# Patient Record
Sex: Female | Born: 1940 | Race: White | Hispanic: No | Marital: Single | Smoking: Former smoker
Health system: Southern US, Community
[De-identification: ages and names within clinical notes are randomized; demographics above are authoritative.]

## PROBLEM LIST (undated history)

## (undated) DIAGNOSIS — H269 Unspecified cataract: Secondary | ICD-10-CM

## (undated) DIAGNOSIS — G8929 Other chronic pain: Secondary | ICD-10-CM

## (undated) DIAGNOSIS — I499 Cardiac arrhythmia, unspecified: Secondary | ICD-10-CM

## (undated) DIAGNOSIS — M549 Dorsalgia, unspecified: Secondary | ICD-10-CM

## (undated) DIAGNOSIS — G4700 Insomnia, unspecified: Secondary | ICD-10-CM

## (undated) DIAGNOSIS — I272 Pulmonary hypertension, unspecified: Secondary | ICD-10-CM

## (undated) DIAGNOSIS — F32A Depression, unspecified: Secondary | ICD-10-CM

## (undated) DIAGNOSIS — M810 Age-related osteoporosis without current pathological fracture: Secondary | ICD-10-CM

## (undated) DIAGNOSIS — M1990 Unspecified osteoarthritis, unspecified site: Secondary | ICD-10-CM

## (undated) DIAGNOSIS — Z8679 Personal history of other diseases of the circulatory system: Secondary | ICD-10-CM

## (undated) DIAGNOSIS — F329 Major depressive disorder, single episode, unspecified: Secondary | ICD-10-CM

## (undated) HISTORY — PX: DILATION AND CURETTAGE OF UTERUS: SHX78

## (undated) HISTORY — PX: EYE SURGERY: SHX253

## (undated) HISTORY — PX: TONSILLECTOMY: SUR1361

---

## 2017-04-15 ENCOUNTER — Other Ambulatory Visit: Payer: Self-pay | Admitting: Orthopedic Surgery

## 2017-04-15 ENCOUNTER — Ambulatory Visit
Admission: RE | Admit: 2017-04-15 | Discharge: 2017-04-15 | Disposition: A | Discharge status: Home / Self Care | Payer: Medicare Other | Source: Ambulatory Visit | Attending: Orthopedic Surgery | Admitting: Orthopedic Surgery

## 2017-04-15 DIAGNOSIS — S99912A Unspecified injury of left ankle, initial encounter: Secondary | ICD-10-CM

## 2018-12-17 ENCOUNTER — Other Ambulatory Visit: Payer: Self-pay | Admitting: Internal Medicine

## 2018-12-17 ENCOUNTER — Other Ambulatory Visit (HOSPITAL_COMMUNITY): Payer: Self-pay | Admitting: Internal Medicine

## 2018-12-17 DIAGNOSIS — I340 Nonrheumatic mitral (valve) insufficiency: Secondary | ICD-10-CM

## 2018-12-18 ENCOUNTER — Other Ambulatory Visit: Payer: Self-pay | Admitting: Internal Medicine

## 2018-12-18 DIAGNOSIS — Z1231 Encounter for screening mammogram for malignant neoplasm of breast: Secondary | ICD-10-CM

## 2019-02-19 ENCOUNTER — Other Ambulatory Visit: Payer: Self-pay

## 2019-02-19 ENCOUNTER — Emergency Department (HOSPITAL_BASED_OUTPATIENT_CLINIC_OR_DEPARTMENT_OTHER): Payer: Medicare Other

## 2019-02-19 ENCOUNTER — Encounter (HOSPITAL_BASED_OUTPATIENT_CLINIC_OR_DEPARTMENT_OTHER): Payer: Self-pay | Admitting: Emergency Medicine

## 2019-02-19 ENCOUNTER — Emergency Department (HOSPITAL_BASED_OUTPATIENT_CLINIC_OR_DEPARTMENT_OTHER)
Admission: EM | Admit: 2019-02-19 | Discharge: 2019-02-19 | Disposition: A | Discharge status: Home / Self Care | Payer: Medicare Other | Attending: Emergency Medicine | Admitting: Emergency Medicine

## 2019-02-19 DIAGNOSIS — S82141A Displaced bicondylar fracture of right tibia, initial encounter for closed fracture: Secondary | ICD-10-CM | POA: Diagnosis not present

## 2019-02-19 DIAGNOSIS — S8991XA Unspecified injury of right lower leg, initial encounter: Secondary | ICD-10-CM | POA: Diagnosis present

## 2019-02-19 DIAGNOSIS — Y92002 Bathroom of unspecified non-institutional (private) residence single-family (private) house as the place of occurrence of the external cause: Secondary | ICD-10-CM | POA: Diagnosis not present

## 2019-02-19 DIAGNOSIS — W182XXA Fall in (into) shower or empty bathtub, initial encounter: Secondary | ICD-10-CM | POA: Insufficient documentation

## 2019-02-19 DIAGNOSIS — Y999 Unspecified external cause status: Secondary | ICD-10-CM | POA: Diagnosis not present

## 2019-02-19 DIAGNOSIS — Y93F1 Activity, caregiving, bathing: Secondary | ICD-10-CM | POA: Diagnosis not present

## 2019-02-19 HISTORY — DX: Other chronic pain: G89.29

## 2019-02-19 HISTORY — DX: Insomnia, unspecified: G47.00

## 2019-02-19 HISTORY — DX: Dorsalgia, unspecified: M54.9

## 2019-02-19 MED ORDER — FENTANYL CITRATE (PF) 100 MCG/2ML IJ SOLN
25.0000 ug | Freq: Once | INTRAMUSCULAR | Status: AC
  Administered 2019-02-19: 13:00:00 25 ug via SUBCUTANEOUS
  Filled 2019-02-19: qty 2

## 2019-02-19 MED ORDER — OXYCODONE HCL 5 MG PO TABS
5.0000 mg | ORAL_TABLET | Freq: Once | ORAL | Status: AC
  Administered 2019-02-19: 12:00:00 5 mg via ORAL
  Filled 2019-02-19: qty 1

## 2019-02-19 MED ORDER — HYDROMORPHONE HCL 1 MG/ML IJ SOLN
0.5000 mg | Freq: Once | INTRAMUSCULAR | Status: AC | PRN
  Administered 2019-02-19: 0.5 mg via INTRAVENOUS
  Filled 2019-02-19: qty 1

## 2019-02-19 MED ORDER — HYDROMORPHONE HCL 1 MG/ML IJ SOLN
0.5000 mg | Freq: Once | INTRAMUSCULAR | Status: AC
  Administered 2019-02-19: 16:00:00 0.5 mg via SUBCUTANEOUS
  Filled 2019-02-19: qty 1

## 2019-02-19 MED ORDER — OXYCODONE HCL 5 MG PO TABS: 5.0000 mg | ORAL_TABLET | ORAL | 0 refills | Status: DC | PRN

## 2019-02-19 NOTE — ED Notes (Signed)
Brought to xr, assisted with positioning, pt reports increased pain with change in position.  Given oxycodone as ordered.  Pt requesting to speak to MD for more rapid acting medication.

## 2019-02-19 NOTE — ED Notes (Signed)
Pt in xr, MD gave additional pain medication order to be utilized if she is unable to maintain necessary positioning for xr studies.  XR tech made aware of orders if needed.

## 2019-02-19 NOTE — Progress Notes (Deleted)
..      Durable Medical Equipment  (From admission, onward)         Start     Ordered   02/19/19 1605  For home use only DME lightweight manual wheelchair with seat cushion  Once    Comments:  Patient suffers from a broken bone in the right knee which impairs their ability to perform daily activities like bathing, dressing and grooming in the home.  A cane, crutch or walker will not resolve  issue with performing activities of daily living. A wheelchair will allow patient to safely perform daily activities. Patient is not able to propel themselves in the home using a standard weight wheelchair due to non-weight bearing restriction to the right leg. Patient can self propel in the lightweight wheelchair.  Accessories: elevating leg rests (ELRs), wheel locks, extensions and anti-tippers.   02/19/19 1606

## 2019-02-19 NOTE — Discharge Instructions (Addendum)
You were evaluated in the Emergency Department and after careful evaluation, we did not find any emergent condition requiring admission or further testing in the hospital.  Your symptoms today seem to be due to a broken bone in the knee, specifically a tibial plateau fracture.  We discussed this injury with the orthopedic surgeons, and they recommend immobilization of the knee as well as nonweightbearing status.  This means that no weight can be put on the right leg.  We have arranged for a wheelchair to be picked up today.  Please call the orthopedic surgeon's office tomorrow to set up an appointment for early next week.  Dr. Ninfa Linden will be expecting your call.  Please return to the Emergency Department if you experience any worsening of your condition.  We encourage you to follow up with a primary care provider.  Thank you for allowing [place] to be a part of your care.

## 2019-02-19 NOTE — ED Triage Notes (Signed)
Per EMS:  Pt was in the shower and slipped.  Fell on right knee.  Some swelling and painful to touch.  No head injury.

## 2019-02-19 NOTE — ED Provider Notes (Signed)
[facility] Emergency Department Provider Note MRN:  [id number]  Arrival date & time: 02/19/19     Chief Complaint   Fall   History of Present Illness   Michelle Vaughan is a 78 y.o. year-old female with no pertinent past medical history presenting to the ED with chief complaint of fall.  Patient explains that due to the coronavirus she was moved from her independent living facility back with her son.  Was using the sons shower, slipped and fell onto her right side.  Mostly fell onto her right knee.  Pain in the knee is moderate in severity, worse with motion.  Denies chest pain or shortness of breath, denies abdominal pain, denies head trauma, no loss of consciousness, denies anticoagulant use.  Pain seems to radiate to the right hip as well as the lower leg.  Review of Systems  A complete 10 system review of systems was obtained and all systems are negative except as noted in the HPI and PMH.   Patient's Health History    Past Medical History:  Diagnosis Date  . Chronic back pain   . Insomnia     History reviewed. No pertinent surgical history.  No family history on file.  Social History   Socioeconomic History  . Marital status: Single    Spouse name: Not on file  . Number of children: Not on file  . Years of education: Not on file  . Highest education level: Not on file  Occupational History  . Not on file  Social Needs  . Financial resource strain: Not on file  . Food insecurity:    Worry: Not on file    Inability: Not on file  . Transportation needs:    Medical: Not on file    Non-medical: Not on file  Tobacco Use  . Smoking status: Not on file  Substance and Sexual Activity  . Alcohol use: Not on file  . Drug use: Not on file  . Sexual activity: Not on file  Lifestyle  . Physical activity:    Days per week: Not on file    Minutes per session: Not on file  . Stress: Not on file  Relationships  . Social connections:    Talks on  phone: Not on file    Gets together: Not on file    Attends religious service: Not on file    Active member of club or organization: Not on file    Attends meetings of clubs or organizations: Not on file    Relationship status: Not on file  . Intimate partner violence:    Fear of current or ex partner: Not on file    Emotionally abused: Not on file    Physically abused: Not on file    Forced sexual activity: Not on file  Other Topics Concern  . Not on file  Social History Narrative  . Not on file     Physical Exam  Vital Signs and Nursing Notes reviewed Vitals:   02/19/19 1419 02/19/19 1557  BP: 138/69 (!) 136/100  Pulse: (!) 57 88  Resp: 16 20  Temp:    SpO2: 100% 100%    CONSTITUTIONAL: Well-appearing, NAD NEURO:  Alert and oriented x 3, no focal deficits EYES:  eyes equal and reactive ENT/NECK:  no LAD, no JVD CARDIO: Regular rate, well-perfused, normal S1 and S2 PULM:  CTAB no wheezing or rhonchi GI/GU:  normal bowel sounds, non-distended, non-tender MSK/SPINE:  No gross deformities, no  edema; mild edema to the right knee with tenderness to palpation to the right knee, right shin, right ankle. SKIN:  no rash, atraumatic PSYCH:  Appropriate speech and behavior  Diagnostic and Interventional Summary    Labs Reviewed - No data to display  DG Pelvis 1-2 Views  Final Result    DG Femur Min 2 Views Right  Final Result    DG Knee Complete 4 Views Right  Final Result    DG Tibia/Fibula Right  Final Result    DG Ankle Complete Right  Final Result    CT Knee Right Wo Contrast    (Results Pending)    Medications  oxyCODONE (Oxy IR/ROXICODONE) immediate release tablet 5 mg (5 mg Oral Given 02/19/19 1226)  fentaNYL (SUBLIMAZE) injection 25 mcg (25 mcg Subcutaneous Given 02/19/19 1255)  HYDROmorphone (DILAUDID) injection 0.5 mg (0.5 mg Subcutaneous Given 02/19/19 1550)  HYDROmorphone (DILAUDID) injection 0.5 mg (0.5 mg Intravenous Given 02/19/19 1626)     Procedures  Critical Care  ED Course and Medical Decision Making  I have reviewed the triage vital signs and the nursing notes.  Pertinent labs & imaging results that were available during my care of the patient were reviewed by me and considered in my medical decision making (see below for details).  Mechanical ground-level fall in this 78 year old female with few comorbidities, no blood thinners, no head trauma, x-rays to exclude fracture.  Strong peripheral pulses at the right dorsalis pedis, neurovascularly intact.        Durable Medical Equipment  (From admission, onward)         Start     Ordered   02/19/19 1636  For home use only DME lightweight manual wheelchair with seat cushion  Once    Comments:  Patient suffers from a broken bone in the right knee which impairs their ability to perform daily activities like bathing, dressing and grooming in the home.  A cane, crutch or walker will not resolve  issue with performing activities of daily living. A wheelchair will allow patient to safely perform daily activities. Patient is not able to propel themselves in the home using a standard weight wheelchair due to non-weight bearing restriction to the right leg. Patient can self propel in the lightweight wheelchair.  Accessories: back/seat cushion, elevating leg rests (ELRs), wheel locks, extensions and anti-tippers.   02/19/19 1636   02/19/19 1627  For home use only DME 3 n 1  Once     02/19/19 1629         X-rays reveal tibial plateau fracture, which was discussed with Dr. Ninfa Linden of orthopedic surgery.  Will obtain CT for possible future surgical planning, will follow up with Dr. Ninfa Linden in his office early next week.  Discussed plan with family, patient will need to be nonweightbearing on the right leg, we have consulted case management to provide family with wheelchair.  Both patient and patient's family feel comfortable assisting the patient through this nonweightbearing restriction until  her follow-up next week.  Still awaiting CT, signed out to Dr. Rogene Houston at shift change ready for dc afterwards.  Placed in immobilizer for comfort.  Barth Kirks. [place] Small, [place] [email or web address]  Final Clinical Impressions(s) / ED Diagnoses     ICD-10-CM   1. Closed fracture of right tibial plateau, initial encounter S82.141A     ED Discharge Orders         Ordered    oxyCODONE (ROXICODONE) 5 MG immediate  release tablet  Every 4 hours PRN     02/19/19 1652             Maudie Flakes, MD 02/19/19 1655

## 2019-02-19 NOTE — ED Notes (Signed)
Pt placed on bedpan. Complaining of pain upon movement. Initially declined pain med but requested some due to increased pain. Notified RN

## 2019-02-19 NOTE — ED Notes (Signed)
Patient transported to CT 

## 2019-02-19 NOTE — ED Notes (Signed)
Pt's daughter to arrive at 1830 to take pt home.

## 2019-02-19 NOTE — Progress Notes (Addendum)
TOC CM spoke to pt's dtr, Olin Hauser. States pt is staying in [place] at her home. States she will pick up wheelchair and 3n1 bedside commode at the retail store in am on [address]. States she will contact Orthopedics Office to arrange follow up appt. Pt has a RW at home. [place] with new referral. Jonnie Finner RN CCM Case Mgmt phone [phone]

## 2019-02-19 NOTE — ED Notes (Signed)
ED Provider at bedside. 

## 2019-02-23 ENCOUNTER — Other Ambulatory Visit: Payer: Self-pay

## 2019-02-23 ENCOUNTER — Encounter (INDEPENDENT_AMBULATORY_CARE_PROVIDER_SITE_OTHER): Payer: Self-pay | Admitting: Orthopaedic Surgery

## 2019-02-23 ENCOUNTER — Ambulatory Visit (INDEPENDENT_AMBULATORY_CARE_PROVIDER_SITE_OTHER): Payer: Medicare Other | Admitting: Orthopaedic Surgery

## 2019-02-23 DIAGNOSIS — S82141A Displaced bicondylar fracture of right tibia, initial encounter for closed fracture: Secondary | ICD-10-CM | POA: Diagnosis not present

## 2019-02-23 MED ORDER — HYDROCODONE-ACETAMINOPHEN 5-325 MG PO TABS: 1.0000 | ORAL_TABLET | Freq: Four times a day (QID) | ORAL | 0 refills | Status: DC | PRN

## 2019-02-23 NOTE — Progress Notes (Signed)
Office Visit Note   Patient: Michelle Vaughan           Date of Birth: Nov 30, 1940           MRN: [id number] Visit Date: 02/23/2019              Requested by: No referring provider defined for this encounter. PCP: System, Pcp Not In   Assessment & Plan: Visit Diagnoses:  1. Tibial plateau fracture, right, closed, initial encounter     Plan: CT images were reviewed with the patient and her daughter in law who is present today.  Dr. Ninfa Linden and myself discussed with her conservative treatment versus surgical intervention.  She would like to proceed with surgical intervention in the near future.  Therefore we will proceed with a right tibial plateau fracture open reduction internal fixation.  Risks benefits discussed with patient at length.  Option of conservative treatment was given to the patient she defers.  She will remain in the knee immobilizer and nonweightbearing until the time of surgery.  Follow-Up Instructions: Return 2 WEEKS POST-OP.   Orders:  No orders of the defined types were placed in this encounter.  Meds ordered this encounter  Medications  . HYDROcodone-acetaminophen (NORCO/VICODIN) 5-325 MG tablet    Sig: Take 1-2 tablets by mouth every 6 (six) hours as needed for moderate pain.    Dispense:  30 tablet    Refill:  0      Procedures: No procedures performed   Clinical Data: No additional findings.   Subjective: Chief Complaint  Patient presents with  . Right Knee - Injury, Fracture, Pain    HPI Michelle Vaughan is a 78 year old female who referred from the ER due to a right tibial plateau fracture.  She reports she was showering on 02/19/2019 and slipped in the shower injuring the right knee.  She went to the [facility], ER where she was found to have a comminuted lateral tibial plateau fracture with some impaction and moderate displacement.  She denies any loss of consciousness or dizziness at the time of the incident.  She landed predominantly on the right  side and has some bruising down the right lower extremity.  She is placed in a knee immobilizer and has been nonweightbearing.  Patient usually lives in independent living however had been at her family's home due to the COVID-19 pandemic potential risk of being in a skilled facility during this time.  Past medical history pertinent for cholesteremia, hypoglycemia, gastric reflux, chronic back pain and history of hematuria which was worked up.  She denies any chest pain, shortness of breath, orthopnea.  No history of DVT PE does have a remote history of phlebitis.  CT scan dated 02/19/2019 right knee showed comminuted fracture of the lateral tibial plateau with some impaction and up to 8 mm of depression.  Fracture fragments are displaced laterally and posteriorly.  Posterior displacement of approximately 3 mm.  There is a nondisplaced fracture of the medial right tibial spine.  No other fractures evident.  Knee is well located.  No effusion.  Review of Systems  Constitutional: Negative for chills and fever.  Respiratory: Negative for shortness of breath.   Cardiovascular: Negative for chest pain.  Musculoskeletal: Positive for back pain.  Neurological: Negative for dizziness and light-headedness.     Objective: Vital Signs: There were no vitals taken for this visit.  Physical Exam Constitutional:      Appearance: She is not ill-appearing or diaphoretic.  Pulmonary:  Effort: Pulmonary effort is normal.  Neurological:     Mental Status: She is alert and oriented to person, place, and time.  Psychiatric:        Mood and Affect: Mood normal.     Ortho Exam Right knee slight edema.  No skin breakdown.  Minimal ecchymosis lateral aspect of the right knee.  Tenderness along the lateral joint line.  No gross deformity.  Left calf supple nontender.  Dorsal pedal pulses intact.  Slight tenderness over the medial lateral malleolus right ankle.  Specialty Comments:  No specialty comments  available.  Imaging: No results found.   PMFS History: Patient Active Problem List   Diagnosis Date Noted  . Tibial plateau fracture, right, closed, initial encounter 02/23/2019   Past Medical History:  Diagnosis Date  . Chronic back pain   . Insomnia     No family history on file.  No past surgical history on file. Social History   Occupational History  . Not on file  Tobacco Use  . Smoking status: Not on file  Substance and Sexual Activity  . Alcohol use: Not on file  . Drug use: Not on file  . Sexual activity: Not on file

## 2019-02-24 NOTE — Pre-Procedure Instructions (Signed)
CAEDENCE SNOWDEN  02/24/2019      [id number] - [place], [place] - [address] [place] [address] SEC [address] [place] [address] [place] [address] [place] [address] Phone: [phone] Fax: [phone]    Your procedure is scheduled on Friday 02/27/2019.  [facility] admitting office at 0530 A.M.  Call this number if you have problems the morning of surgery:  [phone]   Remember:  Do not eat or drink after midnight.     Take these medicines the morning of surgery with A SIP OF WATER: Clonazepam (Klonopin) Escitalopram (Lexapro) Rosuvastatin (Crestor)  If Needed:  Hydrocodone-acetaminophen (Norco) Oxycodone (Roxicodone)  7 days prior to surgery STOP taking any Aspirin (unless otherwise instructed by your surgeon), Aleve, Naproxen, Ibuprofen, Motrin, Advil, Goody's, BC's, all herbal medications, fish oil, and all vitamins.    Do not wear jewelry, make-up or nail polish.  Do not wear lotions, powders, or perfumes, or deodorant.  Do not shave 48 hours prior to surgery.   Do not bring valuables to the hospital.  [facility] is not responsible for any belongings or valuables.  Contacts, eyeglasses, hearing aids, dentures or bridgework may not be worn into surgery.  Leave your suitcase in the car.  After surgery it may be brought to your room.  For patients admitted to the hospital, discharge time will be determined by your treatment team.  Patients discharged the day of surgery will not be allowed to drive home.   Name and phone number of your driver:    Special instructions:   [place]- Preparing For Surgery  Before surgery, you can play an important role. Because skin is not sterile, your skin needs to be as free of germs as possible. You can reduce the number of germs on your skin by washing with CHG (chlorahexidine gluconate) Soap before surgery.  CHG is an antiseptic cleaner which kills germs and bonds with the skin to  continue killing germs even after washing.    Oral Hygiene is also important to reduce your risk of infection.  Remember - BRUSH YOUR TEETH THE MORNING OF SURGERY WITH YOUR REGULAR TOOTHPASTE  Please do not use if you have an allergy to CHG or antibacterial soaps. If your skin becomes reddened/irritated stop using the CHG.  Do not shave (including legs and underarms) for at least 48 hours prior to first CHG shower. It is OK to shave your face.  Please follow these instructions carefully.   1. Shower the NIGHT BEFORE SURGERY and the MORNING OF SURGERY with CHG.   2. If you chose to wash your hair, wash your hair first as usual with your normal shampoo.  3. After you shampoo, rinse your hair and body thoroughly to remove the shampoo.  4. Use CHG as you would any other liquid soap. You can apply CHG directly to the skin and wash gently with a scrungie or a clean washcloth.   5. Apply the CHG Soap to your body ONLY FROM THE NECK DOWN.  Do not use on open wounds or open sores. Avoid contact with your eyes, ears, mouth and genitals (private parts). Wash Face and genitals (private parts)  with your normal soap.  6. Wash thoroughly, paying special attention to the area where your surgery will be performed.  7. Thoroughly rinse your body with warm water from the neck down.  8. DO NOT shower/wash with your normal soap after  using and rinsing off the CHG Soap.  9. Pat yourself dry with a CLEAN TOWEL.  10. Wear CLEAN PAJAMAS to bed the night before surgery, wear comfortable clothes the morning of surgery  11. Place CLEAN SHEETS on your bed the night of your first shower and DO NOT SLEEP WITH PETS.    Day of Surgery: Shower as stated above. Do not apply any deodorants/lotions.  Please wear clean clothes to the hospital/surgery center.   Remember to brush your teeth WITH YOUR REGULAR TOOTHPASTE.    Please read over the following fact sheets that you were given. Pain Booklet, Coughing and  Deep Breathing and Surgical Site Infection Prevention

## 2019-02-25 ENCOUNTER — Telehealth (INDEPENDENT_AMBULATORY_CARE_PROVIDER_SITE_OTHER): Payer: Self-pay

## 2019-02-25 ENCOUNTER — Other Ambulatory Visit (INDEPENDENT_AMBULATORY_CARE_PROVIDER_SITE_OTHER): Payer: Self-pay | Admitting: Physician Assistant

## 2019-02-25 ENCOUNTER — Inpatient Hospital Stay (HOSPITAL_COMMUNITY)
Admission: RE | Admit: 2019-02-25 | Discharge: 2019-02-25 | Disposition: A | Discharge status: Home / Self Care | Payer: Medicare Other | Source: Ambulatory Visit

## 2019-02-25 NOTE — Telephone Encounter (Signed)
I am fine for the patient to have an order for prescription for an electric lift chair.  Are not there is usually done through some specific company or through some type of order that the company has.  I am fine with what ever is needed.  Even if is just a prescription that says that the patient needs an electric lift chair due to severe lower extremity pain and weakness.

## 2019-02-25 NOTE — Telephone Encounter (Signed)
Wanting to see if Dr. Ninfa Linden could give an order for a electric lift chair for pt. Says pt has chronic back pain which is worse at this time due to her leg pain and having to lay a lot.

## 2019-02-26 ENCOUNTER — Encounter (HOSPITAL_COMMUNITY): Payer: Self-pay

## 2019-02-26 ENCOUNTER — Other Ambulatory Visit: Payer: Self-pay

## 2019-02-26 ENCOUNTER — Encounter (HOSPITAL_COMMUNITY)
Admission: RE | Admit: 2019-02-26 | Discharge: 2019-02-26 | Disposition: A | Discharge status: Home / Self Care | Payer: Medicare Other | Source: Ambulatory Visit | Attending: Orthopaedic Surgery | Admitting: Orthopaedic Surgery

## 2019-02-26 ENCOUNTER — Encounter (HOSPITAL_COMMUNITY): Payer: Self-pay | Admitting: Certified Registered Nurse Anesthetist

## 2019-02-26 DIAGNOSIS — Z01818 Encounter for other preprocedural examination: Secondary | ICD-10-CM

## 2019-02-26 HISTORY — DX: Unspecified cataract: H26.9

## 2019-02-26 HISTORY — DX: Cardiac arrhythmia, unspecified: I49.9

## 2019-02-26 HISTORY — DX: Major depressive disorder, single episode, unspecified: F32.9

## 2019-02-26 HISTORY — DX: Age-related osteoporosis without current pathological fracture: M81.0

## 2019-02-26 HISTORY — DX: Unspecified osteoarthritis, unspecified site: M19.90

## 2019-02-26 HISTORY — DX: Pulmonary hypertension, unspecified: I27.20

## 2019-02-26 HISTORY — DX: Personal history of other diseases of the circulatory system: Z86.79

## 2019-02-26 HISTORY — DX: Depression, unspecified: F32.A

## 2019-02-26 LAB — COMPREHENSIVE METABOLIC PANEL
ALT: 14 U/L (ref 0–44)
AST: 21 U/L (ref 15–41)
Albumin: 2.8 g/dL — ABNORMAL LOW (ref 3.5–5.0)
Alkaline Phosphatase: 67 U/L (ref 38–126)
Anion gap: 10 (ref 5–15)
BUN: 14 mg/dL (ref 8–23)
CO2: 24 mmol/L (ref 22–32)
Calcium: 8.9 mg/dL (ref 8.9–10.3)
Chloride: 107 mmol/L (ref 98–111)
Creatinine, Ser: 0.71 mg/dL (ref 0.44–1.00)
GFR calc Af Amer: >60 mL/min (ref >60)
GFR calc non Af Amer: >60 mL/min (ref >60)
Glucose, Bld: 131 mg/dL — ABNORMAL HIGH (ref 70–99)
Potassium: 3.6 mmol/L (ref 3.5–5.1)
Sodium: 141 mmol/L (ref 135–145)
Total Bilirubin: 0.5 mg/dL (ref 0.3–1.2)
Total Protein: 6 g/dL — ABNORMAL LOW (ref 6.5–8.1)

## 2019-02-26 LAB — CBC
HCT: 33.8 % — ABNORMAL LOW (ref 36.0–46.0)
Hemoglobin: 11 g/dL — ABNORMAL LOW (ref 12.0–15.0)
MCH: 31.2 pg (ref 26.0–34.0)
MCHC: 32.5 g/dL (ref 30.0–36.0)
MCV: 95.8 fL (ref 80.0–100.0)
Platelets: 216 10*3/uL (ref 150–400)
RBC: 3.53 MIL/uL — ABNORMAL LOW (ref 3.87–5.11)
RDW: 13.2 % (ref 11.5–15.5)
WBC: 6.9 10*3/uL (ref 4.0–10.5)
nRBC: 0 % (ref 0.0–0.2)

## 2019-02-26 NOTE — Telephone Encounter (Signed)
LMOM for patient to call me back and let me know if she would like to pick up Rx, or for me to mail it?

## 2019-02-26 NOTE — Progress Notes (Signed)
Sent to [place], [place] for review of cardiac history.  Received requested fax from Dr. Brigitte Pulse, [facility].

## 2019-02-26 NOTE — Pre-Procedure Instructions (Addendum)
Michelle Vaughan  02/26/2019      [facility] #[id number] - [place], [place] - [address] [place] [address] [place] [address] Phone: [phone] Fax: [phone]    Your procedure is scheduled on Friday 02/27/2019.  [facility] admitting office at 0530 A.M.  Call this number if you have problems the morning of surgery:  [phone]  Remember:  Do not eat after midnight.  You may have Clear liquads until 430 am Friday,  4/17  Coffee and tea, regular and decaf Plain Jell-O, Iced Popsicles, Carbonated beverages, Cranberry, grape and apple juices Sports drinks like Gatorade Lightly seasoned clear broth or consume(fat free)  Take these medicines the morning of surgery with by 4:30 am day of surgery:   Hydrocodone-acetaminophen (Norco) or  Oxycodone (Roxicodone) if needed  Drink the Ensure Pre-Surgery Drink by 430 am Friday  STOP Now taking any Aspirin (unless otherwise instructed by your surgeon), Aleve, Naproxen, Ibuprofen, Motrin, Advil, Goody's, BC's, all herbal medications, fish oil, and all vitamins.    Do not wear jewelry, make-up or nail polish.  Do not wear lotions, powders, or perfumes, or  deodorant.  Do not shave 48 hours prior to surgery.   Do not bring valuables to the hospital.  [facility] is not responsible for any  belongings or valuables.  Contacts, eyeglasses, hearing aids, dentures or bridgework may not be worn into surgery.  Leave your suitcase in the car.  After surgery it may be brought to your room.  For patients admitted to the hospital, discharge time will be determined by your treatment team.  Anesthesia to review - yes   Special instructions:   [place]- Preparing For Surgery  Before surgery, you can play an important role. Because skin is not sterile, your skin needs to be as free of germs as possible. You can reduce the number of germs on your skin by washing  with CHG (chlorahexidine gluconate) Soap before surgery.  CHG is an antiseptic cleaner which kills germs and bonds with the skin to continue killing germs even after washing.    Oral Hygiene is also important to reduce your risk of infection.  Remember - BRUSH YOUR TEETH THE MORNING OF SURGERY WITH YOUR REGULAR TOOTHPASTE  Please do not use if you have an allergy to CHG or antibacterial soaps. If your skin becomes reddened/irritated stop using the CHG.  Do not shave (including legs and underarms) for at least 48 hours prior to first CHG shower. It is OK to shave your face.  Please follow these instructions carefully.   1. Shower the [employer] BEFORE SURGERY (Thurs) and the MORNING OF SURGERY (Fri) with CHG.   2. If you chose to wash your hair, wash your hair first as usual with your normal shampoo.  3. After you shampoo, rinse your hair and body thoroughly to remove the shampoo.  4. Use CHG as you would any other liquid soap. You can apply CHG directly to the skin and wash gently with a scrungie or a clean washcloth.   5. Apply the CHG Soap to your body ONLY FROM THE NECK DOWN.  Do not use on open wounds or open sores. Avoid contact with your eyes, ears, mouth and genitals (private parts). Wash Face and genitals (private parts)  with your normal soap.  6. Wash thoroughly, paying special attention to the area where your surgery will be performed.  7. Thoroughly rinse your body with warm water from the neck down.  8. DO NOT shower/wash with your normal soap after using and rinsing off the CHG Soap.  9. Pat yourself dry with a CLEAN TOWEL.  10. Wear CLEAN PAJAMAS to bed the night before surgery, wear comfortable clothes the morning of surgery  11. Place CLEAN SHEETS on your bed the night of your first shower and DO NOT SLEEP WITH PETS.   Day of Surgery: Shower as stated above. Do not apply any deodorants/lotions.  Please wear clean clothes to the hospital/surgery center.   Remember to  brush your teeth WITH YOUR REGULAR TOOTHPASTE.    Please read over the following fact sheets that you were given.  Patient informed of the hospital visitation restriction policy that is now currently in effect.

## 2019-02-26 NOTE — Anesthesia Preprocedure Evaluation (Addendum)
Anesthesia Evaluation  Patient identified by MRN, date of birth, ID band Patient awake    Reviewed: Allergy & Precautions, NPO status , Patient's Chart, lab work & pertinent test results  Airway Mallampati: II  TM Distance: >3 FB Neck ROM: Full    Dental  (+) Dental Advisory Given, Teeth Intact   Pulmonary neg pulmonary ROS, former smoker,    Pulmonary exam normal breath sounds clear to auscultation       Cardiovascular negative cardio ROS Normal cardiovascular exam+ dysrhythmias  Rhythm:Regular Rate:Normal     Neuro/Psych PSYCHIATRIC DISORDERS Depression negative neurological ROS     GI/Hepatic negative GI ROS, Neg liver ROS,   Endo/Other  negative endocrine ROS  Renal/GU negative Renal ROS     Musculoskeletal negative musculoskeletal ROS (+) Arthritis ,   Abdominal   Peds  Hematology negative hematology ROS (+)   Anesthesia Other Findings Day of surgery medications reviewed with the patient.  Reproductive/Obstetrics negative OB ROS                           Anesthesia Physical Anesthesia Plan  ASA: III  Anesthesia Plan: General   Post-op Pain Management: GA combined w/ Regional for post-op pain   Induction: Intravenous and Rapid sequence  PONV Risk Score and Plan: 4 or greater and Ondansetron, Dexamethasone and Treatment may vary due to age or medical condition  Airway Management Planned: Oral ETT  Additional Equipment: None  Intra-op Plan:   Post-operative Plan: Extubation in OR  Informed Consent: I have reviewed the patients History and Physical, chart, labs and discussed the procedure including the risks, benefits and alternatives for the proposed anesthesia with the patient or authorized representative who has indicated his/her understanding and acceptance.     Dental advisory given  Plan Discussed with: CRNA  Anesthesia Plan Comments: (Routine echo's ordered by PCP  to monitor mild PHTN, mild MR, and atrial septal aneurysm. Last Echo 08/04/2017 done in [place]. Results below, copy on pt chart.  TTE 08/04/2017: Conclusions: MVP borderline TR and MR mild, AR trace, estimated RVSP 30 mmHg LV diastolic dysfunction, grade 2, good LV systolic function, EF 65 to 70%.  Findings: Left ventricle: Calculated left ventricular ejection fraction with apical correction factor 65 to 70%.  Normal left ventricular chamber size.  Normal left ventricular systolic function.  Left ventricular diastolic dysfunction, grade 2.  Normal left ventricular wall thickness.  No significant wall motion abnormality. Right ventricle: Estimated right ventricular systolic pressure; 30 mmHg.  Normal right ventricular size.  Normal right ventricular systolic function. Left atrium: Normal left atrial size. Right atrium: Normal right atrial size. Aortic valve: Aortic valve is minimal to mildly sclerotic in structure, good mobility and increased in thickness, trace aortic regurgitation. Mitral valve: Mitral valve is borderline bowing in structure, good mobility and thickness.  Mild mitral valve regurgitation. Pulmonary valve: Pulmonary valve is normal in structure, mobility and thickness.  No pulmonary valve regurgitation.  New Tricuspid valve: Tricuspid valve is normal in structure, mobility and thickness.  Mild tricuspid regurgitation. Pericardium and spaces: No significant pericardial effusion. Aorta: Aortic root and proximal aorta are normal in dimension and minimal to mildly sclerotic in structure. Other echo findings: Hypermobile intra-atrial septum and borderline bulges into atrial cavity.  No evidence of shunt at atrial level.  No evidence of intracardiac mass or thrombus.  Normal inferior vena cava dimension with visualized inspiratory collapse.)      Anesthesia Quick Evaluation

## 2019-02-26 NOTE — Telephone Encounter (Signed)
Patient called back and will pick up Rx, I put this at front desk

## 2019-02-27 ENCOUNTER — Inpatient Hospital Stay (HOSPITAL_COMMUNITY): Payer: Medicare Other | Admitting: Certified Registered Nurse Anesthetist

## 2019-02-27 ENCOUNTER — Telehealth: Payer: Self-pay | Admitting: Internal Medicine

## 2019-02-27 ENCOUNTER — Encounter (HOSPITAL_COMMUNITY)
Admission: RE | Disposition: A | Discharge status: Home Health | Payer: Self-pay | Source: Home / Self Care | Attending: Orthopaedic Surgery

## 2019-02-27 ENCOUNTER — Encounter (HOSPITAL_COMMUNITY): Payer: Self-pay | Admitting: Certified Registered Nurse Anesthetist

## 2019-02-27 ENCOUNTER — Inpatient Hospital Stay (HOSPITAL_COMMUNITY): Payer: Medicare Other

## 2019-02-27 ENCOUNTER — Inpatient Hospital Stay (HOSPITAL_COMMUNITY)
Admission: RE | Admit: 2019-02-27 | Discharge: 2019-03-03 | DRG: 494 | Disposition: A | Discharge status: Home Health | Payer: Medicare Other | Attending: Orthopaedic Surgery | Admitting: Orthopaedic Surgery

## 2019-02-27 DIAGNOSIS — I272 Pulmonary hypertension, unspecified: Secondary | ICD-10-CM | POA: Diagnosis present

## 2019-02-27 DIAGNOSIS — F329 Major depressive disorder, single episode, unspecified: Secondary | ICD-10-CM | POA: Diagnosis present

## 2019-02-27 DIAGNOSIS — M810 Age-related osteoporosis without current pathological fracture: Secondary | ICD-10-CM | POA: Diagnosis present

## 2019-02-27 DIAGNOSIS — S82141A Displaced bicondylar fracture of right tibia, initial encounter for closed fracture: Secondary | ICD-10-CM | POA: Diagnosis present

## 2019-02-27 DIAGNOSIS — Z881 Allergy status to other antibiotic agents status: Secondary | ICD-10-CM

## 2019-02-27 DIAGNOSIS — S82121A Displaced fracture of lateral condyle of right tibia, initial encounter for closed fracture: Secondary | ICD-10-CM | POA: Diagnosis present

## 2019-02-27 DIAGNOSIS — W19XXXA Unspecified fall, initial encounter: Secondary | ICD-10-CM | POA: Diagnosis present

## 2019-02-27 DIAGNOSIS — M549 Dorsalgia, unspecified: Secondary | ICD-10-CM | POA: Diagnosis present

## 2019-02-27 DIAGNOSIS — Z9842 Cataract extraction status, left eye: Secondary | ICD-10-CM

## 2019-02-27 DIAGNOSIS — Z87891 Personal history of nicotine dependence: Secondary | ICD-10-CM

## 2019-02-27 DIAGNOSIS — Z79899 Other long term (current) drug therapy: Secondary | ICD-10-CM

## 2019-02-27 DIAGNOSIS — M19041 Primary osteoarthritis, right hand: Secondary | ICD-10-CM | POA: Diagnosis present

## 2019-02-27 DIAGNOSIS — Z419 Encounter for procedure for purposes other than remedying health state, unspecified: Secondary | ICD-10-CM

## 2019-02-27 DIAGNOSIS — M19042 Primary osteoarthritis, left hand: Secondary | ICD-10-CM | POA: Diagnosis present

## 2019-02-27 DIAGNOSIS — M25561 Pain in right knee: Secondary | ICD-10-CM | POA: Diagnosis present

## 2019-02-27 DIAGNOSIS — Z880 Allergy status to penicillin: Secondary | ICD-10-CM

## 2019-02-27 DIAGNOSIS — Z9841 Cataract extraction status, right eye: Secondary | ICD-10-CM | POA: Diagnosis not present

## 2019-02-27 DIAGNOSIS — G8929 Other chronic pain: Secondary | ICD-10-CM | POA: Diagnosis present

## 2019-02-27 HISTORY — PX: ORIF TIBIA PLATEAU: SHX2132

## 2019-02-27 SURGERY — OPEN REDUCTION INTERNAL FIXATION (ORIF) TIBIAL PLATEAU
Anesthesia: General | Site: Knee | Laterality: Right

## 2019-02-27 MED ORDER — SUGAMMADEX SODIUM 200 MG/2ML IV SOLN
INTRAVENOUS | Status: DC | PRN
  Administered 2019-02-27: 125 mg via INTRAVENOUS

## 2019-02-27 MED ORDER — PROPOFOL 10 MG/ML IV BOLUS
INTRAVENOUS | Status: DC | PRN
  Administered 2019-02-27: 140 mg via INTRAVENOUS

## 2019-02-27 MED ORDER — CLINDAMYCIN PHOSPHATE 600 MG/50ML IV SOLN
600.0000 mg | Freq: Four times a day (QID) | INTRAVENOUS | Status: AC
  Administered 2019-02-27 – 2019-02-28 (×3): 600 mg via INTRAVENOUS
  Filled 2019-02-27 (×3): qty 50

## 2019-02-27 MED ORDER — HYDROMORPHONE HCL 1 MG/ML IJ SOLN
0.5000 mg | INTRAMUSCULAR | Status: DC | PRN
  Administered 2019-02-28 (×2): 1 mg via INTRAVENOUS
  Filled 2019-02-27 (×2): qty 1

## 2019-02-27 MED ORDER — PHENYLEPHRINE 40 MCG/ML (10ML) SYRINGE FOR IV PUSH (FOR BLOOD PRESSURE SUPPORT)
PREFILLED_SYRINGE | INTRAVENOUS | Status: AC
  Filled 2019-02-27: qty 10

## 2019-02-27 MED ORDER — PROMETHAZINE HCL 25 MG/ML IJ SOLN: 6.2500 mg | INTRAMUSCULAR | Status: DC | PRN

## 2019-02-27 MED ORDER — METOCLOPRAMIDE HCL 5 MG/ML IJ SOLN: 5.0000 mg | Freq: Three times a day (TID) | INTRAMUSCULAR | Status: DC | PRN

## 2019-02-27 MED ORDER — ROSUVASTATIN CALCIUM 5 MG PO TABS
5.0000 mg | ORAL_TABLET | Freq: Every day | ORAL | Status: DC
  Administered 2019-02-27 – 2019-03-03 (×5): 5 mg via ORAL
  Filled 2019-02-27 (×5): qty 1

## 2019-02-27 MED ORDER — LIDOCAINE 2% (20 MG/ML) 5 ML SYRINGE
INTRAMUSCULAR | Status: AC
  Filled 2019-02-27: qty 5

## 2019-02-27 MED ORDER — CLINDAMYCIN PHOSPHATE 900 MG/50ML IV SOLN
900.0000 mg | INTRAVENOUS | Status: AC
  Administered 2019-02-27: 900 mg via INTRAVENOUS

## 2019-02-27 MED ORDER — METOCLOPRAMIDE HCL 5 MG PO TABS: 5.0000 mg | ORAL_TABLET | Freq: Three times a day (TID) | ORAL | Status: DC | PRN

## 2019-02-27 MED ORDER — METHOCARBAMOL 500 MG PO TABS
500.0000 mg | ORAL_TABLET | Freq: Four times a day (QID) | ORAL | Status: DC | PRN
  Administered 2019-02-28 – 2019-03-03 (×7): 500 mg via ORAL
  Filled 2019-02-27 (×7): qty 1

## 2019-02-27 MED ORDER — CLONAZEPAM 0.5 MG PO TABS: 0.2500 mg | ORAL_TABLET | Freq: Every day | ORAL | Status: DC

## 2019-02-27 MED ORDER — ONDANSETRON HCL 4 MG/2ML IJ SOLN
INTRAMUSCULAR | Status: DC | PRN
  Administered 2019-02-27: 4 mg via INTRAVENOUS

## 2019-02-27 MED ORDER — HYDROMORPHONE HCL 1 MG/ML IJ SOLN: 0.2500 mg | INTRAMUSCULAR | Status: DC | PRN

## 2019-02-27 MED ORDER — CHLORHEXIDINE GLUCONATE 4 % EX LIQD: 60.0000 mL | Freq: Once | CUTANEOUS | Status: DC

## 2019-02-27 MED ORDER — FENTANYL CITRATE (PF) 100 MCG/2ML IJ SOLN
INTRAMUSCULAR | Status: DC | PRN
  Administered 2019-02-27 (×2): 25 ug via INTRAVENOUS
  Administered 2019-02-27: 50 ug via INTRAVENOUS
  Administered 2019-02-27 (×6): 25 ug via INTRAVENOUS

## 2019-02-27 MED ORDER — EPHEDRINE SULFATE-NACL 50-0.9 MG/10ML-% IV SOSY
PREFILLED_SYRINGE | INTRAVENOUS | Status: DC | PRN
  Administered 2019-02-27 (×2): 10 mg via INTRAVENOUS
  Administered 2019-02-27 (×4): 5 mg via INTRAVENOUS
  Administered 2019-02-27: 10 mg via INTRAVENOUS

## 2019-02-27 MED ORDER — DOCUSATE SODIUM 100 MG PO CAPS
100.0000 mg | ORAL_CAPSULE | Freq: Two times a day (BID) | ORAL | Status: DC
  Administered 2019-02-27 – 2019-03-03 (×8): 100 mg via ORAL
  Filled 2019-02-27 (×8): qty 1

## 2019-02-27 MED ORDER — SUCCINYLCHOLINE CHLORIDE 200 MG/10ML IV SOSY
PREFILLED_SYRINGE | INTRAVENOUS | Status: DC | PRN
  Administered 2019-02-27: 80 mg via INTRAVENOUS

## 2019-02-27 MED ORDER — ESCITALOPRAM OXALATE 20 MG PO TABS
20.0000 mg | ORAL_TABLET | Freq: Every day | ORAL | Status: DC
  Administered 2019-02-27 – 2019-03-03 (×5): 20 mg via ORAL
  Filled 2019-02-27 (×4): qty 1

## 2019-02-27 MED ORDER — SUCCINYLCHOLINE CHLORIDE 200 MG/10ML IV SOSY
PREFILLED_SYRINGE | INTRAVENOUS | Status: AC
  Filled 2019-02-27: qty 10

## 2019-02-27 MED ORDER — ACETAMINOPHEN 325 MG PO TABS
325.0000 mg | ORAL_TABLET | Freq: Four times a day (QID) | ORAL | Status: DC | PRN
  Administered 2019-03-02 (×2): 650 mg via ORAL
  Filled 2019-02-27 (×2): qty 2

## 2019-02-27 MED ORDER — EPHEDRINE 5 MG/ML INJ
INTRAVENOUS | Status: AC
  Filled 2019-02-27: qty 10

## 2019-02-27 MED ORDER — ROCURONIUM BROMIDE 50 MG/5ML IV SOSY
PREFILLED_SYRINGE | INTRAVENOUS | Status: DC | PRN
  Administered 2019-02-27: 40 mg via INTRAVENOUS

## 2019-02-27 MED ORDER — CLONIDINE HCL (ANALGESIA) 100 MCG/ML EP SOLN
EPIDURAL | Status: DC | PRN
  Administered 2019-02-27: 50 ug
  Administered 2019-02-27: 30 ug

## 2019-02-27 MED ORDER — ONDANSETRON HCL 4 MG/2ML IJ SOLN: 4.0000 mg | Freq: Four times a day (QID) | INTRAMUSCULAR | Status: DC | PRN

## 2019-02-27 MED ORDER — FENTANYL CITRATE (PF) 250 MCG/5ML IJ SOLN
INTRAMUSCULAR | Status: AC
  Filled 2019-02-27: qty 5

## 2019-02-27 MED ORDER — LIDOCAINE 2% (20 MG/ML) 5 ML SYRINGE
INTRAMUSCULAR | Status: DC | PRN
  Administered 2019-02-27: 100 mg via INTRAVENOUS

## 2019-02-27 MED ORDER — METHOCARBAMOL 1000 MG/10ML IJ SOLN
500.0000 mg | Freq: Four times a day (QID) | INTRAVENOUS | Status: DC | PRN
  Filled 2019-02-27: qty 5

## 2019-02-27 MED ORDER — DEXAMETHASONE SODIUM PHOSPHATE 10 MG/ML IJ SOLN
INTRAMUSCULAR | Status: AC
  Filled 2019-02-27: qty 1

## 2019-02-27 MED ORDER — OXYCODONE HCL 5 MG PO TABS
5.0000 mg | ORAL_TABLET | ORAL | Status: DC | PRN
  Administered 2019-02-27 – 2019-03-03 (×6): 10 mg via ORAL
  Filled 2019-02-27 (×6): qty 2

## 2019-02-27 MED ORDER — BUPIVACAINE HCL (PF) 0.5 % IJ SOLN
INTRAMUSCULAR | Status: DC | PRN
  Administered 2019-02-27: 30 mL via PERINEURAL
  Administered 2019-02-27: 20 mL via PERINEURAL

## 2019-02-27 MED ORDER — ROCURONIUM BROMIDE 50 MG/5ML IV SOSY
PREFILLED_SYRINGE | INTRAVENOUS | Status: AC
  Filled 2019-02-27: qty 5

## 2019-02-27 MED ORDER — PROPOFOL 10 MG/ML IV BOLUS
INTRAVENOUS | Status: AC
  Filled 2019-02-27: qty 20

## 2019-02-27 MED ORDER — LACTATED RINGERS IV SOLN
INTRAVENOUS | Status: DC | PRN
  Administered 2019-02-27 (×2): via INTRAVENOUS

## 2019-02-27 MED ORDER — OXYCODONE HCL 5 MG PO TABS
10.0000 mg | ORAL_TABLET | ORAL | Status: DC | PRN
  Administered 2019-02-27: 15 mg via ORAL
  Administered 2019-02-28 (×2): 10 mg via ORAL
  Administered 2019-03-01 – 2019-03-02 (×6): 15 mg via ORAL
  Filled 2019-02-27 (×4): qty 3
  Filled 2019-02-27 (×2): qty 2
  Filled 2019-02-27 (×3): qty 3

## 2019-02-27 MED ORDER — ONDANSETRON HCL 4 MG/2ML IJ SOLN
INTRAMUSCULAR | Status: AC
  Filled 2019-02-27: qty 2

## 2019-02-27 MED ORDER — CLONAZEPAM 0.125 MG PO TBDP
0.2500 mg | ORAL_TABLET | Freq: Every day | ORAL | Status: DC
  Administered 2019-02-27 – 2019-03-02 (×4): 0.25 mg via ORAL
  Filled 2019-02-27 (×4): qty 2

## 2019-02-27 MED ORDER — MEPERIDINE HCL 50 MG/ML IJ SOLN: 6.2500 mg | INTRAMUSCULAR | Status: DC | PRN

## 2019-02-27 MED ORDER — DEXAMETHASONE SODIUM PHOSPHATE 10 MG/ML IJ SOLN
INTRAMUSCULAR | Status: DC | PRN
  Administered 2019-02-27: 10 mg via INTRAVENOUS

## 2019-02-27 MED ORDER — DIPHENHYDRAMINE HCL 12.5 MG/5ML PO ELIX
12.5000 mg | ORAL_SOLUTION | ORAL | Status: DC | PRN
  Administered 2019-03-02: 25 mg via ORAL
  Filled 2019-02-27: qty 10

## 2019-02-27 MED ORDER — ONDANSETRON HCL 4 MG PO TABS: 4.0000 mg | ORAL_TABLET | Freq: Four times a day (QID) | ORAL | Status: DC | PRN

## 2019-02-27 MED ORDER — CLINDAMYCIN PHOSPHATE 900 MG/50ML IV SOLN
INTRAVENOUS | Status: AC
  Filled 2019-02-27: qty 50

## 2019-02-27 MED ORDER — SODIUM CHLORIDE 0.9 % IV SOLN
INTRAVENOUS | Status: DC
  Administered 2019-02-27: 18:00:00 via INTRAVENOUS

## 2019-02-27 MED ORDER — 0.9 % SODIUM CHLORIDE (POUR BTL) OPTIME
TOPICAL | Status: DC | PRN
  Administered 2019-02-27: 1000 mL

## 2019-02-27 SURGICAL SUPPLY — 83 items
BANDAGE ACE 4X5 VEL STRL LF (GAUZE/BANDAGES/DRESSINGS) ×3 IMPLANT
BANDAGE ACE 6X5 VEL STRL LF (GAUZE/BANDAGES/DRESSINGS) ×3 IMPLANT
BANDAGE ESMARK 6X9 LF (GAUZE/BANDAGES/DRESSINGS) ×1 IMPLANT
BIT DRILL 2.5 (BIT) ×1
BIT DRILL 2.5MM (BIT) ×1
BIT DRILL 3.1 (BIT) ×2 IMPLANT
BIT DRILL 3.1MM (BIT) ×1
BIT DRILL SHRT 216X2.5XNS LF (BIT) ×1 IMPLANT
BIT DRL SHRT 216X2.5XNS LF (BIT) ×1
BNDG COHESIVE 4X5 TAN STRL (GAUZE/BANDAGES/DRESSINGS) ×3 IMPLANT
BNDG ESMARK 6X9 LF (GAUZE/BANDAGES/DRESSINGS) ×3
BONE CANC CHIPS 20CC PCAN1/4 (Bone Implant) ×3 IMPLANT
CHIPS CANC BONE 20CC PCAN1/4 (Bone Implant) ×1 IMPLANT
CLEANER TIP ELECTROSURG 2X2 (MISCELLANEOUS) ×3 IMPLANT
COVER MAYO STAND STRL (DRAPES) ×3 IMPLANT
COVER WAND RF STERILE (DRAPES) ×3 IMPLANT
CUFF TOURNIQUET SINGLE 34IN LL (TOURNIQUET CUFF) ×3 IMPLANT
DRAPE C-ARM 42X72 X-RAY (DRAPES) ×3 IMPLANT
DRAPE C-ARMOR (DRAPES) ×3 IMPLANT
DRAPE ORTHO SPLIT 77X108 STRL (DRAPES) ×4
DRAPE SURG ORHT 6 SPLT 77X108 (DRAPES) ×2 IMPLANT
DRAPE U-SHAPE 47X51 STRL (DRAPES) ×3 IMPLANT
DRSG PAD ABDOMINAL 8X10 ST (GAUZE/BANDAGES/DRESSINGS) ×3 IMPLANT
DRSG XEROFORM 1X8 (GAUZE/BANDAGES/DRESSINGS) ×3 IMPLANT
DURAPREP 26ML APPLICATOR (WOUND CARE) ×3 IMPLANT
ELECT REM PT RETURN 9FT ADLT (ELECTROSURGICAL) ×3
ELECTRODE REM PT RTRN 9FT ADLT (ELECTROSURGICAL) ×1 IMPLANT
EVACUATOR 1/8 PVC DRAIN (DRAIN) IMPLANT
GAUZE SPONGE 4X4 12PLY STRL (GAUZE/BANDAGES/DRESSINGS) ×3 IMPLANT
GAUZE XEROFORM 1X8 LF (GAUZE/BANDAGES/DRESSINGS) ×3 IMPLANT
GLOVE BIO SURGEON STRL SZ8 (GLOVE) ×3 IMPLANT
GLOVE BIOGEL PI IND STRL 8 (GLOVE) ×1 IMPLANT
GLOVE BIOGEL PI INDICATOR 8 (GLOVE) ×2
GLOVE ORTHO TXT STRL SZ7.5 (GLOVE) ×3 IMPLANT
GOWN EXTRA PROTECTION XXL 0583 (GOWNS) ×3 IMPLANT
GOWN STRL REUS W/ TWL LRG LVL3 (GOWN DISPOSABLE) ×2 IMPLANT
GOWN STRL REUS W/ TWL XL LVL3 (GOWN DISPOSABLE) ×2 IMPLANT
GOWN STRL REUS W/TWL LRG LVL3 (GOWN DISPOSABLE) ×4
GOWN STRL REUS W/TWL XL LVL3 (GOWN DISPOSABLE) ×4
IMMOBILIZER KNEE 22 UNIV (SOFTGOODS) IMPLANT
K-WIRE SMOOTH 2.0X150 (WIRE) ×6
KIT BASIN OR (CUSTOM PROCEDURE TRAY) ×3 IMPLANT
KIT TURNOVER KIT B (KITS) ×3 IMPLANT
KWIRE SMOOTH 2.0X150 (WIRE) ×2 IMPLANT
MANIFOLD NEPTUNE II (INSTRUMENTS) ×3 IMPLANT
NS IRRIG 1000ML POUR BTL (IV SOLUTION) ×3 IMPLANT
PACK ORTHO EXTREMITY (CUSTOM PROCEDURE TRAY) ×3 IMPLANT
PAD ABD 8X10 STRL (GAUZE/BANDAGES/DRESSINGS) ×2 IMPLANT
PAD ARMBOARD 7.5X6 YLW CONV (MISCELLANEOUS) ×6 IMPLANT
PAD CAST 4YDX4 CTTN HI CHSV (CAST SUPPLIES) ×1 IMPLANT
PADDING CAST COTTON 4X4 STRL (CAST SUPPLIES) ×2
PADDING CAST COTTON 6X4 STRL (CAST SUPPLIES) ×3 IMPLANT
PENCIL BUTTON HOLSTER BLD 10FT (ELECTRODE) ×3 IMPLANT
PLATE PROXIMAL TIB RT 2H (Plate) ×3 IMPLANT
SCREW CANC 4.0X70MM (Screw) ×3 IMPLANT
SCREW CANCELLOUS 4.0MMX60MM (Screw) ×3 IMPLANT
SCREW CORT 30X3.5XST LCK NS (Screw) ×1 IMPLANT
SCREW CORT 32X3.5XST LCK NS (Screw) ×1 IMPLANT
SCREW CORTICAL 3.5X30MM (Screw) ×2 IMPLANT
SCREW CORTICAL 3.5X32MM (Screw) ×2 IMPLANT
SCREW LOCKING 4.0X70MM (Screw) ×6 IMPLANT
SCREW LOCKING 65X4.0MM (Screw) ×3 IMPLANT
SPONGE LAP 18X18 RF (DISPOSABLE) ×3 IMPLANT
STAPLER VISISTAT 35W (STAPLE) ×3 IMPLANT
STOCKINETTE IMPERVIOUS 9X36 MD (GAUZE/BANDAGES/DRESSINGS) ×3 IMPLANT
STOCKINETTE IMPERVIOUS LG (DRAPES) ×3 IMPLANT
SUCTION FRAZIER HANDLE 10FR (MISCELLANEOUS) ×2
SUCTION TUBE FRAZIER 10FR DISP (MISCELLANEOUS) ×1 IMPLANT
SUT ETHILON 2 0 PSLX (SUTURE) ×3 IMPLANT
SUT VIC AB 0 CT1 27 (SUTURE) ×4
SUT VIC AB 0 CT1 27XBRD ANBCTR (SUTURE) ×2 IMPLANT
SUT VIC AB 1 CT1 27 (SUTURE) ×2
SUT VIC AB 1 CT1 27XBRD ANBCTR (SUTURE) ×1 IMPLANT
SUT VIC AB 2-0 CT1 27 (SUTURE) ×2
SUT VIC AB 2-0 CT1 TAPERPNT 27 (SUTURE) ×1 IMPLANT
SYR BULB IRRIGATION 50ML (SYRINGE) ×3 IMPLANT
TOWEL OR 17X24 6PK STRL BLUE (TOWEL DISPOSABLE) ×3 IMPLANT
TOWEL OR 17X26 10 PK STRL BLUE (TOWEL DISPOSABLE) ×3 IMPLANT
TUBE CONNECTING 12'X1/4 (SUCTIONS) ×1
TUBE CONNECTING 12X1/4 (SUCTIONS) ×2 IMPLANT
UNDERPAD 30X30 (UNDERPADS AND DIAPERS) ×3 IMPLANT
WATER STERILE IRR 1000ML POUR (IV SOLUTION) ×3 IMPLANT
YANKAUER SUCT BULB TIP NO VENT (SUCTIONS) ×3 IMPLANT

## 2019-02-27 NOTE — Anesthesia Postprocedure Evaluation (Signed)
Anesthesia Post Note  Patient: Michelle Vaughan  Procedure(s) Performed: OPEN REDUCTION INTERNAL FIXATION (ORIF) RIGHT TIBIAL PLATEAU FRACTURE (Right Knee)     Patient location during evaluation: PACU Anesthesia Type: General Level of consciousness: sedated and patient cooperative Pain management: pain level controlled Vital Signs Assessment: post-procedure vital signs reviewed and stable Respiratory status: spontaneous breathing Cardiovascular status: stable Anesthetic complications: no    Last Vitals:  Vitals:   02/27/19 1330 02/27/19 1345  BP: 113/82 (!) 103/56  Pulse: 88 87  Resp: 17 15  Temp:    SpO2: 96% 97%    Last Pain:  Vitals:   02/27/19 1330  TempSrc:   PainSc: 0-No pain                 Nolon Nations

## 2019-02-27 NOTE — Transfer of Care (Signed)
Immediate Anesthesia Transfer of Care Note  Patient: Michelle Vaughan  Procedure(s) Performed: OPEN REDUCTION INTERNAL FIXATION (ORIF) RIGHT TIBIAL PLATEAU FRACTURE (Right Knee)  Patient Location: PACU  Anesthesia Type:GA combined with regional for post-op pain  Level of Consciousness: awake, alert  and oriented  Airway & Oxygen Therapy: Patient Spontanous Breathing and Patient connected to face mask oxygen  Post-op Assessment: Report given to RN and Post -op Vital signs reviewed and stable  Post vital signs: Reviewed and stable  Last Vitals:  Vitals Value Taken Time  BP    Temp    Pulse 101 02/27/2019 10:28 AM  Resp 18 02/27/2019 10:28 AM  SpO2 100 % 02/27/2019 10:28 AM  Vitals shown include unvalidated device data.  Last Pain:  Vitals:   02/27/19 0554  TempSrc: Oral         Complications: No apparent anesthesia complications

## 2019-02-27 NOTE — Op Note (Signed)
NAME: DEMIRA, GWYNNE MEDICAL RECORD ZS:[id number] ACCOUNT [id number] DATE OF BIRTH:1941-01-31 FACILITY: [facility] LOCATION: [facility] PHYSICIAN:CHRISTOPHER Kerry Fort, MD  OPERATIVE REPORT  DATE OF PROCEDURE:  02/27/2019  PREOPERATIVE DIAGNOSIS:  Right split depressed lateral tibial plateau fracture.  POSTOPERATIVE DIAGNOSIS:  Right split depressed lateral tibial plateau fracture.  PROCEDURE:  Open reduction internal fixation of right lateral tibial plateau fracture.  IMPLANTS:  Stryker AxSOS proximal lateral tibial plate and screws.  SURGEON:  Lind Guest. Ninfa [place], MD  ASSISTANT:  Erskine Emery, PA-C  ANESTHESIA: 1.  Right lower extremity regional block. 2.  General.  ANTIBIOTICS:  900 mg IV clindamycin.  ESTIMATED BLOOD LOSS:  Less  than 50 mL.  TOURNIQUET TIME:  Two hours.  COMPLICATIONS:  None.  INDICATIONS:  The patient is a very pleasant 78 year old female who had accidentally sustained a mechanical fall a week ago injuring her right knee.  In the emergency room, she was found to have a lateral tibial plateau fracture.  She did not need  external fixation for ligamentotaxis but did need to allow for soft tissue swelling to subside.  We then allowed to proceed with surgery.  We recommended surgery because a CT scan of her right knee does show a depressed area of the fracture and it split  indicating an unstable fracture pattern.  She actually has valgus knees.  She is not overweight, but just indicative of valgus knees due to this fracture pattern and instability associated with it, surgery is warranted and we have recommended this.  We  talked about the nonoperative and operative treatment options.  We talked about the risks and benefits of surgery and she does wish to proceed.  DESCRIPTION OF PROCEDURE:  After informed consent was obtained and appropriate right knee was marked.  Anesthesia obtained, right lower extremity block in the holding room.  She was then  brought to the operating room and placed supine on the operating  table.  General anesthesia was then obtained.  A nonsterile tourniquet was placed on the upper right thigh and her right thigh, knee, leg and ankle were prepped and draped with DuraPrep and sterile drapes and a sterile stockinette was applied as well.  A  time-out was called and she was identified as the correct patient, correct right knee.  We then used an Esmarch to wrap that leg and tourniquet was inflated to 300 mm of pressure.  I then made a curvilinear incision at the lateral tibial plateau and  carried this proximally and distally.  I dissected down to the fracture site itself and slowly teased muscle and fascia off the bone.  We were able to expose the fracture site and assessed it clinically, visually and radiographically.  We were able to  tamp up the articular surface.  It was in the central part of the knee and then we backfilled the deficit with cancellous chips.  This was all done under direct visualization and fluoroscopy.  We then chose our Stryker AxSOS 3 lateral tibial plate and  secured this to the lateral tibia, provisionally placing K wires to hold it in place unit we were pleased with bringing the posterior lateral articular surface and cortical alignment back in place.  Once we were pleased with that, we secured the plate  with bicortical screws and locking screws distally and bicortical and locking screws proximally as well as some cancellous screws.  We were pleased with the stability and the fracture fixation after this.  We then irrigated the soft tissue with  normal  saline solution.  I did open up the knee joint to make sure that the lateral meniscus was not down into the fracture site itself and it was not.  We irrigated the knee joint and closed the arthrotomy with #1 Vicryl suture.  We reapproximated the fascia  over the plate with #1 Vicryl suture followed by 0 Vicryl in the deep tissue, 2-0 Vicryl subcutaneous  tissue and interrupted 3-0 nylon in the skin.  Xeroform well-padded sterile dressing was applied.  The tourniquet was let down her fingers and her toes  pinked up nicely.    She was taken to recovery room in stable condition.    COUNTS:  All final counts were correct.    COMPLICATIONS:  There were no complications noted.    Of note, Benita Stabile, PA-C, assisted the entire case.  His assistance was crucial for facilitating all aspects of this case.  AN/NUANCE  D:02/27/2019 T:02/27/2019 JOB:[id number]

## 2019-02-27 NOTE — Anesthesia Procedure Notes (Signed)
Procedure Name: Intubation Date/Time: 02/27/2019 7:47 AM Performed by: Candis Shine, CRNA Pre-anesthesia Checklist: Patient identified, Emergency Drugs available, Suction available and Patient being monitored Patient Re-evaluated:Patient Re-evaluated prior to induction Oxygen Delivery Method: Circle System Utilized Preoxygenation: Pre-oxygenation with 100% oxygen Induction Type: IV induction and Rapid sequence Laryngoscope Size: Mac and 3 Grade View: Grade I Tube type: Oral Tube size: 7.0 mm Number of attempts: 1 Airway Equipment and Method: Stylet Placement Confirmation: ETT inserted through vocal cords under direct vision,  positive ETCO2 and breath sounds checked- equal and bilateral Secured at: 22 cm Tube secured with: Tape Dental Injury: Teeth and Oropharynx as per pre-operative assessment

## 2019-02-27 NOTE — Brief Op Note (Signed)
02/27/2019  10:08 AM  PATIENT:  Michelle Vaughan  78 y.o. female  PRE-OPERATIVE DIAGNOSIS:  right knee lateral tibial plateau fracture  POST-OPERATIVE DIAGNOSIS:  right knee lateral tibial plateau fracture  PROCEDURE:  Procedure(s): OPEN REDUCTION INTERNAL FIXATION (ORIF) RIGHT TIBIAL PLATEAU FRACTURE (Right)  SURGEON:  Surgeon(s) and Role:    Mcarthur Rossetti, MD - Primary  PHYSICIAN ASSISTANT:  Benita Stabile, PA-c  ANESTHESIA:   regional and general  EBL:  25 mL   COUNTS:  YES  TOURNIQUET:   Total Tourniquet Time Documented: Thigh (Right) - 120 minutes Total: Thigh (Right) - 120 minutes   DICTATION: .Other Dictation: Dictation Number [id number]  PLAN OF CARE: Admit to inpatient   PATIENT DISPOSITION:  PACU - hemodynamically stable.   Delay start of Pharmacological VTE agent (>24hrs) due to surgical blood loss or risk of bleeding: no

## 2019-02-27 NOTE — Plan of Care (Signed)

## 2019-02-27 NOTE — H&P (Signed)
Michelle Vaughan is an 78 y.o. female.   Chief Complaint: Right knee pain with known lateral tibial plateau fracture HPI: The patient is a 78 year old female who sustained an accidental mechanical fall a week ago injuring her right knee.  She was seen in the emergency room and found to have a split-depressed lateral tibial plateau fracture.  This was confirmed with plain films and a CT scan.  She now presents for definitive fixation of this unstable injury having had allow the soft tissue swelling to resolve after a week of elevation and ice.  Past Medical History:  Diagnosis Date  . 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 Cataracts, bilateral    surgery to remove   . Chronic back pain   . Depression   . Dysrhythmia    occasional  . History of cardiac aneurysm    from PCP in [place]  . Insomnia   . Osteoporosis    no med  . Pulmonary HTN ([facility])    Hx from PCP in [place]    Past Surgical History:  Procedure Laterality Date  . DILATION AND CURETTAGE OF UTERUS     x 3  . EYE SURGERY     cataract eye surgery - Bilateral  . TONSILLECTOMY      History reviewed. No pertinent family history. Social History:  reports that she quit smoking about 4 years ago. Her smoking use included cigarettes. She has a 28.50 pack-year smoking history. She has never used smokeless tobacco. She reports previous alcohol use. She reports that she does not use drugs.  Allergies:  Allergies  Allergen Reactions  . Penicillins Rash    Did it involve swelling of the face/tongue/throat, SOB, or low BP? No Did it involve sudden or severe rash/hives, skin peeling, or any reaction on the inside of your mouth or nose? Unknown Did you need to seek medical attention at a hospital or doctor's office? No When did it last happen?In her 95s If all above answers are "NO", may proceed with cephalosporin use.   . Metronidazole Rash    Medications Prior to Admission  Medication Sig Dispense Refill  . clonazePAM (KLONOPIN) 0.5  MG tablet Take 0.25 mg by mouth at bedtime.     Marland Kitchen escitalopram (LEXAPRO) 20 MG tablet Take 20 mg by mouth daily after lunch.     Marland Kitchen HYDROcodone-acetaminophen (NORCO/VICODIN) 5-325 MG tablet Take 1-2 tablets by mouth every 6 (six) hours as needed for moderate pain. 30 tablet 0  . oxyCODONE (ROXICODONE) 5 MG immediate release tablet Take 1 tablet (5 mg total) by mouth every 4 (four) hours as needed for severe pain. (Patient taking differently: Take 5 mg by mouth every 4 (four) hours as needed for severe pain. This medication if for after surgery-post op pain.  Patient has not started taking this medication) 15 tablet 0  . rosuvastatin (CRESTOR) 5 MG tablet Take 5 mg by mouth daily.      Results for orders placed or performed during the hospital encounter of 02/26/19 (from the past 48 hour(s))  CBC     Status: Abnormal   Collection Time: 02/26/19  2:20 PM  Result Value Ref Range   WBC 6.9 4.0 - 10.5 K/uL   RBC 3.53 (L) 3.87 - 5.11 MIL/uL   Hemoglobin 11.0 (L) 12.0 - 15.0 g/dL   HCT 33.8 (L) 36.0 - 46.0 %   MCV 95.8 80.0 - 100.0 fL   MCH 31.2 26.0 - 34.0 pg  MCHC 32.5 30.0 - 36.0 g/dL   RDW 13.2 11.5 - 15.5 %   Platelets 216 150 - 400 K/uL   nRBC 0.0 0.0 - 0.2 %    Comment: Performed at [facility], [place] [address], [place], [place] [address]  Comprehensive metabolic panel     Status: Abnormal   Collection Time: 02/26/19  2:20 PM  Result Value Ref Range   Sodium 141 135 - 145 mmol/L   Potassium 3.6 3.5 - 5.1 mmol/L   Chloride 107 98 - 111 mmol/L   CO2 24 22 - 32 mmol/L   Glucose, Bld 131 (H) 70 - 99 mg/dL   BUN 14 8 - 23 mg/dL   Creatinine, Ser 0.71 0.44 - 1.00 mg/dL   Calcium 8.9 8.9 - 10.3 mg/dL   Total Protein 6.0 (L) 6.5 - 8.1 g/dL   Albumin 2.8 (L) 3.5 - 5.0 g/dL   AST 21 15 - 41 U/L   ALT 14 0 - 44 U/L   Alkaline Phosphatase 67 38 - 126 U/L   Total Bilirubin 0.5 0.3 - 1.2 mg/dL   GFR calc non Af Amer >60 >60 mL/min   GFR calc Af Amer >60 >60 mL/min   Anion gap 10 5 -  15    Comment: Performed at [facility] [address] [place], [place] [address]   No results found.  Review of Systems  Musculoskeletal: Positive for back pain.  All other systems reviewed and are negative.   Blood pressure (!) 110/44, pulse (!) 58, temperature 98.6 F (37 C), temperature source Oral, resp. rate 18, height 5' 2.5" (1.588 m), weight 61.2 kg, SpO2 99 %. Physical Exam  Constitutional: She is oriented to person, place, and time. She appears well-developed and well-nourished.  HENT:  Head: Normocephalic and atraumatic.  Eyes: Pupils are equal, round, and reactive to light.  Neck: Normal range of motion. Neck supple.  Cardiovascular: Normal rate.  Respiratory: Effort normal.  GI: Soft. Bowel sounds are normal.  Musculoskeletal:     Right knee: She exhibits decreased range of motion, swelling, effusion, ecchymosis, abnormal alignment and bony tenderness. Tenderness found. Lateral joint line tenderness noted.  Neurological: She is alert and oriented to person, place, and time.  Skin: Skin is warm and dry.  Psychiatric: She has a normal mood and affect.     Assessment/Plan Right knee with displaced (split-depressed) lateral tibial plateau fracture  Due to the unstable nature of this fracture we are recommending open reduction-internal fixation.  This was explained in detail to the patient as well as her family.  We saw her in the office earlier this week to go over knee model and explained the risk and benefits of surgery and the recommendations of operative versus nonoperative treatment and go over those options.  The family did weigh these options and decide on surgery which I think is reasonable given the unstable nature of this fracture.  Informed consent is obtained.  Postoperatively should be done to the hospital for IV antibiotics, pain control and working on mobility with physical therapy since she will be nonweightbearing on that right lower extremity for  the next 4 to 6 weeks.  Mcarthur Rossetti, MD 02/27/2019, 7:26 AM

## 2019-02-27 NOTE — Anesthesia Procedure Notes (Addendum)
Anesthesia Regional Block: Popliteal block   Pre-Anesthetic Checklist: ,, timeout performed, Correct Patient, Correct Site, Correct Laterality, Correct Procedure, Correct Position, site marked, Risks and benefits discussed,  Surgical consent,  Pre-op evaluation,  At surgeon's request and post-op pain management  Laterality: Right  Prep: chloraprep       Needles:  Injection technique: Single-shot  Needle Type: Stimiplex     Needle Length: 10cm  Needle Gauge: 21     Additional Needles:   Procedures:,,,, ultrasound used (permanent image in chart),,,,  Motor weakness within 5 minutes.  Narrative:  Start time: 02/27/2019 7:26 AM End time: 02/27/2019 7:29 AM Injection made incrementally with aspirations every 5 mL.  Performed by: Personally  Anesthesiologist: Nolon Nations, MD  Additional Notes: Nerve located and needle positioned with direct ultrasound guidance. Good perineural spread. Patient tolerated well.

## 2019-02-27 NOTE — Social Work (Signed)
Please order therapies for post surgical eval.  Westley Hummer, MSW, [place] [facility] [phone]

## 2019-02-27 NOTE — Telephone Encounter (Signed)
Review of echo order Need records from Dr Manya Silvas office to determine indication/acutiy for current echo before rescheduling

## 2019-02-27 NOTE — Anesthesia Procedure Notes (Signed)
Anesthesia Regional Block: Adductor canal block   Pre-Anesthetic Checklist: ,, timeout performed, Correct Patient, Correct Site, Correct Laterality, Correct Procedure, Correct Position, site marked, Risks and benefits discussed,  Surgical consent,  Pre-op evaluation,  At surgeon's request and post-op pain management  Laterality: Right  Prep: chloraprep       Needles:  Injection technique: Single-shot  Needle Type: Stimiplex     Needle Length: 9cm  Needle Gauge: 21     Additional Needles:   Procedures:,,,, ultrasound used (permanent image in chart),,,,  Narrative:  Start time: 02/27/2019 7:23 AM End time: 02/27/2019 7:25 AM Injection made incrementally with aspirations every 5 mL.  Performed by: Personally  Anesthesiologist: Nolon Nations, MD  Additional Notes: BP cuff, EKG monitors applied. Sedation begun. Artery and nerve location verified with U/S and anesthetic injected incrementally, slowly, and after negative aspirations under direct u/s guidance. Good fascial /perineural spread. Tolerated well.

## 2019-02-28 NOTE — Progress Notes (Signed)
     Subjective: 1 Day Post-Op Procedure(s) (LRB): OPEN REDUCTION INTERNAL FIXATION (ORIF) RIGHT TIBIAL PLATEAU FRACTURE (Right) Awake,alert and oriented x 4. [place] while staying with daughter on porcelin floor slipped. Normal in independent living but family is having her stay with them during this period of social isolation associated with the pandemic. She plans to go to sons home post hospitalization.  Patient reports pain as moderate.    Objective:   VITALS:  Temp:  [97 F (36.1 C)-98.4 F (36.9 C)] 98.2 F (36.8 C) (04/18 0442) Pulse Rate:  [73-95] 88 (04/18 0442) Resp:  [15-27] 16 (04/18 0442) BP: (97-127)/(40-82) 112/62 (04/18 0442) SpO2:  [92 %-99 %] 98 % (04/18 0442)  ABD soft Sensation intact distally Intact pulses distally Incision: dressing C/D/I and no drainage Compartment soft Moderate swelling right calf and foot Weak right foot dorsiflexion trace contraction, apparently weak preop.   LABS Recent Labs    02/26/19 1420  HGB 11.0*  WBC 6.9  PLT 216   Recent Labs    02/26/19 1420  NA 141  K 3.6  CL 107  CO2 24  BUN 14  CREATININE 0.71  GLUCOSE 131*   No results for input(s): LABPT, INR in the last 72 hours.   Assessment/Plan: 1 Day Post-Op Procedure(s) (LRB): OPEN REDUCTION INTERNAL FIXATION (ORIF) RIGHT TIBIAL PLATEAU FRACTURE (Right)  Advance diet Up with therapy D/C IV fluids  Work with PT and OT to gain independence to where she may be able to  Go to son's home with [facility] for PT, OT.   Basil Dess 02/28/2019, 10:08 AMPatient ID: Michelle Vaughan, female   DOB: Sep 05, 1941, 78 y.o.   MRN: [id number]

## 2019-02-28 NOTE — Evaluation (Signed)
Physical Therapy Evaluation Patient Details Name: CARREN BLAKLEY MRN: [id number] DOB: November 04, 1941 Today's Date: 02/28/2019   History of Present Illness  Pt is a 78 y/o female s/p ORIF R tibia, NWB R LE. PMH including but not limited to HTN.  Clinical Impression  Pt presented supine in bed with HOB elevated, awake and willing to participate in therapy session. Prior to admission and initial injury, pt reported that she was independent with all functional mobility and ADLs. Pt has been staying with her son and his family (wife and 10 children). She will have 24/7 supervision/physical assistance upon d/c. At the time of evaluation, pt required min A for bed mobility and mod A x2 for transfers with use of RW. Pt would continue to benefit from skilled physical therapy services at this time while admitted and after d/c to address the below listed limitations in order to improve overall safety and independence with functional mobility.     Follow Up Recommendations Home health PT;Supervision/Assistance - 24 hour    Equipment Recommendations  Rolling walker with 5" wheels    Recommendations for Other Services       Precautions / Restrictions Precautions Precautions: Fall Restrictions Weight Bearing Restrictions: Yes RLE Weight Bearing: Non weight bearing      Mobility  Bed Mobility Overal bed mobility: Needs Assistance Bed Mobility: Supine to Sit     Supine to sit: Min assist     General bed mobility comments: increased time and effort, assistance with R LE management  Transfers Overall transfer level: Needs assistance Equipment used: Rolling walker (2 wheeled) Transfers: Sit to/from [employer] Transfers Sit to Stand: +2 physical assistance;+2 safety/equipment;Mod assist Stand pivot transfers: +2 safety/equipment;Mod assist       General transfer comment: verbal and tactile cueing needed for technique and safe hand placement; heavy physical assistance needed to power  into standing from EOB x1 and from [facility] x1; assistance needed for stability with transitional movements as well; pt pivoting towards her L side x2  Ambulation/Gait                Stairs            Wheelchair Mobility    Modified Rankin (Stroke Patients Only)       Balance Overall balance assessment: Needs assistance Sitting-balance support: Feet supported Sitting balance-Leahy Scale: Fair     Standing balance support: Bilateral upper extremity supported Standing balance-Leahy Scale: Poor Standing balance comment: reliant on bilateral UEs on RW                             Pertinent Vitals/Pain Pain Assessment: Faces Faces Pain Scale: Hurts little more Pain Location: R LE Pain Descriptors / Indicators: Sore Pain Intervention(s): Monitored during session;Repositioned    Home Living Family/patient expects to be discharged to:: Private residence Living Arrangements: Children(grandchildren (son) ) Available Help at Discharge: Family;Available 24 hours/day Type of Home: House Home Access: Stairs to enter   [employer] of Steps: 3 Home Layout: Two level;Able to live on main level with bedroom/bathroom Home Equipment: Wheelchair - [employer] - 4 wheels;Bedside commode      Prior Function Level of Independence: Independent         Comments: independent prior to injury; has required physical assistance from son for all mobility since injury     Hand Dominance        Extremity/Trunk Assessment   Upper Extremity Assessment Upper Extremity Assessment: Defer  to OT evaluation;Overall WFL for tasks assessed    Lower Extremity Assessment Lower Extremity Assessment: RLE deficits/detail RLE Deficits / Details: pt with decreased strength and limited ROM secondary to post-op pain and weakness       Communication   Communication: No difficulties  Cognition Arousal/Alertness: Awake/alert Behavior During Therapy: WFL for tasks  assessed/performed Overall Cognitive Status: Within Functional Limits for tasks assessed                                        General Comments      Exercises     Assessment/Plan    PT Assessment Patient needs continued PT services  PT Problem List Decreased strength;Decreased range of motion;Decreased activity tolerance;Decreased balance;Decreased mobility;Decreased coordination;Decreased knowledge of use of DME;Decreased knowledge of precautions;Decreased safety awareness;Pain       PT Treatment Interventions DME instruction;Gait training;Stair training;Functional mobility training;Therapeutic activities;Therapeutic exercise;Balance training;Neuromuscular re-education;Patient/family education    PT Goals (Current goals can be found in the Care Plan section)  Acute Rehab PT Goals Patient Stated Goal: decrease pain PT Goal Formulation: With patient Time For Goal Achievement: 03/14/19 Potential to Achieve Goals: Good    Frequency Min 5X/week   Barriers to discharge        Co-evaluation PT/OT/SLP Co-Evaluation/Treatment: Yes Reason for Co-Treatment: For patient/therapist safety;To address functional/ADL transfers PT goals addressed during session: Mobility/safety with mobility;Balance;Proper use of DME;Strengthening/ROM         AM-PAC PT "6 Clicks" Mobility  Outcome Measure Help needed turning from your back to your side while in a flat bed without using bedrails?: A Little Help needed moving from lying on your back to sitting on the side of a flat bed without using bedrails?: A Little Help needed moving to and from a bed to a chair (including a wheelchair)?: A Lot Help needed standing up from a chair using your arms (e.g., wheelchair or bedside chair)?: A Lot Help needed to walk in hospital room?: Total Help needed climbing 3-5 steps with a railing? : Total 6 Click Score: 12    End of Session Equipment Utilized During Treatment: Gait belt Activity  Tolerance: Patient tolerated treatment well Patient left: in chair;with call bell/phone within reach;with chair alarm set Nurse Communication: Mobility status PT Visit Diagnosis: Other abnormalities of gait and mobility (R26.89);Pain Pain - Right/Left: Right Pain - part of body: Leg;Knee    Time: 9983-3825 PT Time Calculation (min) (ACUTE ONLY): 33 min   Charges:   PT Evaluation $PT Eval Moderate Complexity: 1 Mod          Sherie Don, PT, DPT  Acute Rehabilitation Services Pager [phone] Office [place] 02/28/2019, 12:33 PM

## 2019-02-28 NOTE — Plan of Care (Signed)
  Problem: Activity: Goal: Risk for activity intolerance will decrease Outcome: Progressing   Problem: Coping: Goal: Level of anxiety will decrease Outcome: Progressing   Problem: Pain Managment: Goal: General experience of comfort will improve Outcome: Progressing   Problem: Safety: Goal: Ability to remain free from injury will improve Outcome: Progressing   Problem: Skin Integrity: Goal: Risk for impaired skin integrity will decrease Outcome: Progressing   

## 2019-02-28 NOTE — Evaluation (Signed)
Occupational Therapy Evaluation Patient Details Name: Michelle Vaughan MRN: [id number] DOB: 18-Feb-1941 Today's Date: 02/28/2019    History of Present Illness Pt is a 78 y/o female s/p ORIF R tibia, NWB R LE. PMH including but not limited to HTN.   Clinical Impression   PTA/inital injury patient independent.  Admitted for above and limited by problem list below, including pain, NWB R LE, impaired balance, and decreased activity tolerance.Patient educated on precautions, safety, ADL compensatory techniques and recommendations.  Patient reports she has been living with her son and his family, will have 24/7 support at discharge.  She requires setup assist for UB ADLs, min-mod assist +2 safety for LB ADLs, mod assist +2 for stand pivot transfers and total assist for toileting.   She will benefit from continued OT services while admitted and after dc at [facility] level in order to optimize independence and safety with ADLs.  Will follow.     Follow Up Recommendations  Home health OT;Supervision/Assistance - 24 hour    Equipment Recommendations  None recommended by OT    Recommendations for Other Services       Precautions / Restrictions Precautions Precautions: Fall Restrictions Weight Bearing Restrictions: Yes RLE Weight Bearing: Non weight bearing      Mobility Bed Mobility Overal bed mobility: Needs Assistance Bed Mobility: Supine to Sit     Supine to sit: Min assist     General bed mobility comments: increased time and effort, assistance with R LE management  Transfers Overall transfer level: Needs assistance Equipment used: Rolling walker (2 wheeled) Transfers: Sit to/from [employer] Transfers Sit to Stand: +2 physical assistance;+2 safety/equipment;Mod assist Stand pivot transfers: +2 safety/equipment;Mod assist       General transfer comment: verbal and tactile cueing needed for technique and safe hand placement; heavy physical assistance needed to power into  standing from EOB x1 and from [facility] x1; assistance needed for stability with transitional movements as well; pt pivoting towards her L side x2    Balance Overall balance assessment: Needs assistance Sitting-balance support: Feet supported Sitting balance-Leahy Scale: Fair     Standing balance support: Bilateral upper extremity supported Standing balance-Leahy Scale: Poor Standing balance comment: reliant on bilateral UEs on RW                           ADL either performed or assessed with clinical judgement   ADL Overall ADL's : Needs assistance/impaired     Grooming: Set up;Sitting   Upper Body Bathing: Set up;Sitting   Lower Body Bathing: Moderate assistance;+2 for physical assistance;+2 for safety/equipment;Sit to/from stand Lower Body Bathing Details (indicate cue type and reason): min assist for R LE, +2 sit <>stand   Upper Body Dressing : Set up;Sitting   Lower Body Dressing: Moderate assistance;+2 for physical assistance;+2 for safety/equipment;Sit to/from stand   Toilet Transfer: Moderate assistance;+2 for safety/equipment;Stand-pivot;RW;BSC   Toileting- Clothing Manipulation and Hygiene: Total assistance;+2 for safety/equipment;Sit to/from stand       Functional mobility during ADLs: Moderate assistance;Cueing for safety;Rolling walker General ADL Comments: pt limited by generalized weakness, pain in RLE and NWB, impaired balance      Vision         Perception     Praxis      Pertinent Vitals/Pain Pain Assessment: Faces Faces Pain Scale: Hurts little more Pain Location: R LE Pain Descriptors / Indicators: Sore Pain Intervention(s): Monitored during session;Repositioned     Hand Dominance  Extremity/Trunk Assessment Upper Extremity Assessment Upper Extremity Assessment: Overall WFL for tasks assessed   Lower Extremity Assessment Lower Extremity Assessment: Defer to PT evaluation RLE Deficits / Details: pt with decreased strength and  limited ROM secondary to post-op pain and weakness       Communication Communication Communication: No difficulties   Cognition Arousal/Alertness: Awake/alert Behavior During Therapy: WFL for tasks assessed/performed Overall Cognitive Status: Within Functional Limits for tasks assessed                                     General Comments       Exercises     Shoulder Instructions      Home Living Family/patient expects to be discharged to:: Private residence Living Arrangements: Children(grandchildren (son) ) Available Help at Discharge: Family;Available 24 hours/day Type of Home: House Home Access: Stairs to enter [employer] of Steps: 3   Home Layout: Two level;Able to live on main level with bedroom/bathroom     Bathroom Shower/Tub: (half bath down stairs )   Biochemist, clinical: Standard     Home Equipment: Wheelchair - [employer] - 4 wheels;Bedside commode          Prior Functioning/Environment Level of Independence: Independent        Comments: independent prior to injury; has required physical assistance from son for all mobility since injury        OT Problem List: Decreased strength;Decreased activity tolerance;Impaired balance (sitting and/or standing);Decreased safety awareness;Decreased knowledge of use of DME or AE;Decreased knowledge of precautions;Pain      OT Treatment/Interventions: Self-care/ADL training;Therapeutic exercise;DME and/or AE instruction;Therapeutic activities;Patient/family education;Balance training    OT Goals(Current goals can be found in the care plan section) Acute Rehab OT Goals Patient Stated Goal: decrease pain OT Goal Formulation: With patient Time For Goal Achievement: 03/14/19 Potential to Achieve Goals: Good ADL Goals Pt Will Perform Grooming: with modified independence;sitting Pt Will Perform Lower Body Bathing: with adaptive equipment;sit to/from stand;with supervision Pt Will Perform  Lower Body Dressing: with supervision;sit to/from stand;with adaptive equipment Pt Will Transfer to Toilet: with min assist;bedside commode;stand pivot transfer Pt Will Perform Toileting - Clothing Manipulation and hygiene: with supervision;sitting/lateral leans;sit to/from stand Pt Will Perform Tub/Shower Transfer: Shower transfer;3 in 1;with supervision;rolling walker;Stand pivot transfer  OT Frequency: Min 2X/week   Barriers to D/C:            Co-evaluation PT/OT/SLP Co-Evaluation/Treatment: Yes Reason for Co-Treatment: For patient/therapist safety;To address functional/ADL transfers PT goals addressed during session: Mobility/safety with mobility;Balance;Proper use of DME;Strengthening/ROM OT goals addressed during session: ADL's and self-care      AM-PAC OT "6 Clicks" Daily Activity     Outcome Measure Help from another person eating meals?: None Help from another person taking care of personal grooming?: None(seated) Help from another person toileting, which includes using toliet, bedpan, or urinal?: Total Help from another person bathing (including washing, rinsing, drying)?: A Lot Help from another person to put on and taking off regular upper body clothing?: None Help from another person to put on and taking off regular lower body clothing?: A Lot 6 Click Score: 17   End of Session Equipment Utilized During Treatment: Gait belt;Rolling walker Nurse Communication: Mobility status  Activity Tolerance: Patient tolerated treatment well Patient left: in chair;with call bell/phone within reach;with chair alarm set  OT Visit Diagnosis: Other abnormalities of gait and mobility (R26.89);Pain Pain - Right/Left: Right Pain -  part of body: Leg                Time: 3818-2993 OT Time Calculation (min): 31 min Charges:  OT General Charges $OT Visit: 1 Visit OT Evaluation $OT Eval Moderate Complexity: [place], [place] Acute Rehabilitation Services Pager  [phone] Office [phone]   Delight Stare 02/28/2019, 12:39 PM

## 2019-03-01 NOTE — Progress Notes (Signed)
Physical Therapy Treatment Patient Details Name: Michelle Vaughan MRN: [id number] DOB: 10-Jan-1941 Today's Date: 03/01/2019    History of Present Illness Pt is a 78 y/o female s/p ORIF R tibia, NWB R LE. PMH including but not limited to HTN.    PT Comments    Patient seen for activity progression and transfer training. Patient continues to demonstrate significant need for physical assist. Improved overall bed mobility and was able to attempt mobility with RW. Will need family to physically assist upon discharge. Current POC remains appropriate.  OF NOTE: Patient with noted heel cord shortening, may benefit from [facility] shoe to help maintain neutral positioning.   Follow Up Recommendations  Home health PT;Supervision/Assistance - 24 hour(if family able to provide adequate assist)     Equipment Recommendations  Rolling walker with 5" wheels    Recommendations for Other Services       Precautions / Restrictions Precautions Precautions: Fall Restrictions Weight Bearing Restrictions: Yes RLE Weight Bearing: Non weight bearing    Mobility  Bed Mobility Overal bed mobility: Needs Assistance Bed Mobility: Supine to Sit     Supine to sit: Min assist     General bed mobility comments: increased time and effort, assistance with R LE management  Transfers Overall transfer level: Needs assistance Equipment used: Rolling walker (2 wheeled) Transfers: Sit to/from [employer] Transfers Sit to Stand: +2 physical assistance;+2 safety/equipment;Mod assist Stand pivot transfers: +2 safety/equipment;Mod assist       General transfer comment: Multi modal cues for positioning, increased time and effort to power up standing, heavy reliance on external assist. Performed from bed and from chair this session. Attempted modified hand placement during power up but still requires moderate assist.   Ambulation/Gait Ambulation/Gait assistance: Max assist;+2 safety/equipment Gait Distance  (Feet): 3 Feet Assistive device: Rolling walker (2 wheeled) Gait Pattern/deviations: Step-to pattern Gait velocity: decreased   General Gait Details: educated on hop to technique, increased time and effort to perform. limited ability to push through UEs   Stairs             Wheelchair Mobility    Modified Rankin (Stroke Patients Only)       Balance Overall balance assessment: Needs assistance Sitting-balance support: Feet supported Sitting balance-Leahy Scale: Fair     Standing balance support: Bilateral upper extremity supported Standing balance-Leahy Scale: Poor Standing balance comment: reliant on bilateral UEs on RW and external assist                            Cognition Arousal/Alertness: Awake/alert Behavior During Therapy: WFL for tasks assessed/performed Overall Cognitive Status: Within Functional Limits for tasks assessed Area of Impairment: Following commands;Problem solving                       Following Commands: Follows one step commands with increased time     Problem Solving: Requires verbal cues;Requires tactile cues;Slow processing General Comments: Cues for techniques during session      Exercises      General Comments        Pertinent Vitals/Pain Pain Assessment: Faces Faces Pain Scale: Hurts little more Pain Location: R LE Pain Descriptors / Indicators: Sore Pain Intervention(s): Monitored during session    Home Living                      Prior Function  PT Goals (current goals can now be found in the care plan section) Acute Rehab PT Goals Patient Stated Goal: decrease pain PT Goal Formulation: With patient Time For Goal Achievement: 03/14/19 Potential to Achieve Goals: Good Progress towards PT goals: Progressing toward goals    Frequency    Min 5X/week      PT Plan Current plan remains appropriate    Co-evaluation PT/OT/SLP Co-Evaluation/Treatment: Yes Reason for  Co-Treatment: For patient/therapist safety PT goals addressed during session: Mobility/safety with mobility OT goals addressed during session: ADL's and self-care      AM-PAC PT "6 Clicks" Mobility   Outcome Measure  Help needed turning from your back to your side while in a flat bed without using bedrails?: A Little Help needed moving from lying on your back to sitting on the side of a flat bed without using bedrails?: A Little Help needed moving to and from a bed to a chair (including a wheelchair)?: A Lot Help needed standing up from a chair using your arms (e.g., wheelchair or bedside chair)?: A Lot Help needed to walk in hospital room?: Total Help needed climbing 3-5 steps with a railing? : Total 6 Click Score: 12    End of Session Equipment Utilized During Treatment: Gait belt Activity Tolerance: Patient tolerated treatment well Patient left: in chair;with call bell/phone within reach;with chair alarm set Nurse Communication: Mobility status PT Visit Diagnosis: Other abnormalities of gait and mobility (R26.89);Pain Pain - Right/Left: Right Pain - part of body: Leg;Knee     Time: 4709-6283 PT Time Calculation (min) (ACUTE ONLY): 23 min  Charges:  $Therapeutic Activity: 8-22 mins                     Alben Deeds, PT DPT  Board Certified Neurologic Specialist [place] Pager [phone] Office [phone]    Duncan Dull 03/01/2019, 8:27 AM

## 2019-03-01 NOTE — Progress Notes (Signed)
Occupational Therapy Treatment Patient Details Name: Michelle Vaughan MRN: [id number] DOB: [birth year] Today's Date: 03/01/2019    History of present illness Pt is a 78 y/o female s/p ORIF R tibia, NWB R LE. PMH including but not limited to HTN.   OT comments  Patient progressing slowly.  Continues to require mod assist +2 for stand pivot transfer using RW, increased time and effort with max verbal cueing for techniques. Reviewed lateral lean techniques for LB ADL compensatory techniques. Pt reports family assisting with "squat pivot" transfers prior to surgery and she feels more comfortable with these transfers, will attempt next session.  Added UB strengthening goal.  Pt reports she will have physical support at discharge, will need assist for transfer and ADLs. Will follow.    Follow Up Recommendations  Home health OT;Supervision/Assistance - 24 hour    Equipment Recommendations  None recommended by OT    Recommendations for Other Services      Precautions / Restrictions Precautions Precautions: Fall Restrictions Weight Bearing Restrictions: Yes RLE Weight Bearing: Non weight bearing       Mobility Bed Mobility Overal bed mobility: Needs Assistance Bed Mobility: Supine to Sit     Supine to sit: Min assist     General bed mobility comments: increased time and effort, assistance with R LE management  Transfers Overall transfer level: Needs assistance Equipment used: Rolling walker (2 wheeled) Transfers: Sit to/from [employer] Transfers Sit to Stand: +2 physical assistance;+2 safety/equipment;Mod assist Stand pivot transfers: +2 safety/equipment;Mod assist       General transfer comment: Multi modal cues for positioning, increased time and effort to power up standing, heavy reliance on external assist. Performed from bed and from chair this session. Attempted modified hand placement during power up but still requires moderate assist.     Balance Overall  balance assessment: Needs assistance Sitting-balance support: Feet supported Sitting balance-Leahy Scale: Fair     Standing balance support: Bilateral upper extremity supported Standing balance-Leahy Scale: Poor Standing balance comment: reliant on bilateral UEs on RW and external assist                           ADL either performed or assessed with clinical judgement   ADL Overall ADL's : Needs assistance/impaired                     Lower Body Dressing: Moderate assistance;Sitting/lateral leans Lower Body Dressing Details (indicate cue type and reason): reviewed compensatory technqiues for LB dressing, wearing gowns to limit needs and lateral shifting  Toilet Transfer: Moderate assistance;+2 for safety/equipment;+2 for physical assistance Toilet Transfer Details (indicate cue type and reason): simulated to recliner Toileting- Clothing Manipulation and Hygiene: Moderate assistance;Sitting/lateral lean Toileting - Clothing Manipulation Details (indicate cue type and reason): reviewed techniques using lateral lean technique to maximize independence      Functional mobility during ADLs: Moderate assistance;Cueing for safety;Rolling walker General ADL Comments: pt limited by generalized weakness, pain in RLE and NWB, impaired balance      Vision       Perception     Praxis      Cognition Arousal/Alertness: Awake/alert Behavior During Therapy: WFL for tasks assessed/performed Overall Cognitive Status: Within Functional Limits for tasks assessed Area of Impairment: Following commands;Problem solving                       Following Commands: Follows one step commands with increased time  Problem Solving: Requires verbal cues;Requires tactile cues;Slow processing General Comments: cognition WFL; verbal cueing for new techniques throughout session        Exercises     Shoulder Instructions       General Comments      Pertinent Vitals/  Pain       Pain Assessment: Faces Faces Pain Scale: Hurts little more Pain Location: R LE Pain Descriptors / Indicators: Sore Pain Intervention(s): Monitored during session;Repositioned  Home Living                                          Prior Functioning/Environment              Frequency  Min 2X/week        Progress Toward Goals  OT Goals(current goals can now be found in the care plan section)  Progress towards OT goals: Progressing toward goals  Acute Rehab OT Goals Patient Stated Goal: decrease pain OT Goal Formulation: With patient Time For Goal Achievement: 03/14/19 Potential to Achieve Goals: Good ADL Goals Pt/caregiver will Perform Home Exercise Program: Increased strength;Both right and left upper extremity;With theraband;With written HEP provided  Plan Discharge plan remains appropriate;Frequency remains appropriate    Co-evaluation    PT/OT/SLP Co-Evaluation/Treatment: Yes Reason for Co-Treatment: For patient/therapist safety PT goals addressed during session: Mobility/safety with mobility OT goals addressed during session: ADL's and self-care      AM-PAC OT "6 Clicks" Daily Activity     Outcome Measure   Help from another person eating meals?: None Help from another person taking care of personal grooming?: None Help from another person toileting, which includes using toliet, bedpan, or urinal?: A Lot Help from another person bathing (including washing, rinsing, drying)?: A Little Help from another person to put on and taking off regular upper body clothing?: None Help from another person to put on and taking off regular lower body clothing?: A Lot 6 Click Score: 19    End of Session Equipment Utilized During Treatment: Gait belt;Rolling walker  OT Visit Diagnosis: Other abnormalities of gait and mobility (R26.89);Pain Pain - Right/Left: Right Pain - part of body: Leg   Activity Tolerance Patient tolerated treatment  well   Patient Left in chair;with call bell/phone within reach;with chair alarm set   Nurse Communication Mobility status        Time: 8453-6468 OT Time Calculation (min): 27 min  Charges: OT General Charges $OT Visit: 1 Visit OT Treatments $Self Care/Home Management : 8-22 mins  Delight Stare, [place] Pager [phone] Office [phone]    Delight Stare 03/01/2019, 9:43 AM

## 2019-03-01 NOTE — Plan of Care (Signed)

## 2019-03-01 NOTE — Progress Notes (Signed)
     Subjective: 2 Days Post-Op Procedure(s) (LRB): OPEN REDUCTION INTERNAL FIXATION (ORIF) RIGHT TIBIAL PLATEAU FRACTURE (Right) Awake, alert and oriented x 4. Dressing is dry, swelling right foot improved.   Patient reports pain as moderate.    Objective:   VITALS:  Temp:  [98.2 F (36.8 C)-99 F (37.2 C)] 98.7 F (37.1 C) (04/19 0445) Pulse Rate:  [68-83] 73 (04/19 0445) Resp:  [17-18] 17 (04/19 0445) BP: (103-113)/(43-63) 109/43 (04/19 0445) SpO2:  [91 %-98 %] 93 % (04/19 0445)  Neurologically intact ABD soft Neurovascular intact Sensation intact distally Intact pulses distally Dorsiflexion/Plantar flexion intact Incision: dressing C/D/I and no drainage   LABS Recent Labs    02/26/19 1420  HGB 11.0*  WBC 6.9  PLT 216   Recent Labs    02/26/19 1420  NA 141  K 3.6  CL 107  CO2 24  BUN 14  CREATININE 0.71  GLUCOSE 131*   No results for input(s): LABPT, INR in the last 72 hours.   Assessment/Plan: 2 Days Post-Op Procedure(s) (LRB): OPEN REDUCTION INTERNAL FIXATION (ORIF) RIGHT TIBIAL PLATEAU FRACTURE (Right)  Advance diet Up with therapy D/C IV fluids  Had increase pain with PT yesterday and today. Needs to work on increasing independence prior to discharge home.  D/C IVF.    Basil Dess 03/01/2019, 11:06 AMPatient ID: Michelle Vaughan, female   DOB: 1941/10/04, 78 y.o.   MRN: [id number]

## 2019-03-02 ENCOUNTER — Encounter (HOSPITAL_COMMUNITY): Payer: Self-pay | Admitting: Orthopaedic Surgery

## 2019-03-02 ENCOUNTER — Telehealth: Payer: Self-pay | Admitting: Internal Medicine

## 2019-03-02 MED ORDER — BISACODYL 10 MG RE SUPP
10.0000 mg | Freq: Once | RECTAL | Status: AC
  Administered 2019-03-02: 10 mg via RECTAL
  Filled 2019-03-02: qty 1

## 2019-03-02 MED ORDER — ASPIRIN EC 325 MG PO TBEC: 325.0000 mg | DELAYED_RELEASE_TABLET | Freq: Every day | ORAL | 0 refills | Status: DC

## 2019-03-02 MED ORDER — SORBITOL 70 % SOLN
960.0000 mL | TOPICAL_OIL | Freq: Once | ORAL | Status: AC
  Administered 2019-03-02: 960 mL via RECTAL
  Filled 2019-03-02: qty 473

## 2019-03-02 MED ORDER — OXYCODONE HCL 5 MG PO TABS: 5.0000 mg | ORAL_TABLET | Freq: Four times a day (QID) | ORAL | 0 refills | Status: DC | PRN

## 2019-03-02 NOTE — Care Management Important Message (Signed)
Important Message  Patient Details  Name: Michelle Vaughan MRN: [id number] Date of Birth: August 04, 1941   Medicare Important Message Given:  Yes    Orbie Pyo 03/02/2019, 3:47 PM

## 2019-03-02 NOTE — Progress Notes (Signed)
Orthopedic Tech Progress Note Patient Details:  Michelle Vaughan October 01, 1941 [id number]  Ortho Devices Ortho Device/Splint Location: right Prafo boot Ortho Device/Splint Interventions: Application   Post Interventions Patient Tolerated: Well Instructions Provided: Care of device   Michelle Vaughan 03/02/2019, 11:55 AM

## 2019-03-02 NOTE — Plan of Care (Signed)

## 2019-03-02 NOTE — Telephone Encounter (Signed)
Please refer to the scanned office visit from Dr. Raul Del office on 12/17/2018 under "Media" tab in the chart.

## 2019-03-02 NOTE — TOC Transition Note (Addendum)
Transition of Care [facility]) - CM/SW Discharge Note   Patient Details  Name: AMERIE BEAUMONT MRN: [id number] Date of Birth: [birth year]  [facility] CM/SW Contact:  Marilu Favre, RN Phone Number: 03/02/2019, 10:28 AM   Clinical Narrative:     Patient recently moved from [place] to [facility] Once Covid virus started she moved in with her son and daughter in law, Olin Hauser and [place].   Patient 's PCP Dr Marton Redwood.   Patient plans to return to her son's home at discharge. Patient has Rolator and 3 in 1 already at home.   Called daughter in law with patient's permission from room. With Pamela's and patient's permission placed Olin Hauser on speaker phone. Confirmed address. Home health agency [facility]. Explained NCM will check with PT to see if Rolator is ok or if need rolling walker. Pamela aware. Olin Hauser also requesting PT to call her to give her "tips" on how to help her mother in law up and down. Also, Olin Hauser plans on transporting her home at discharge, so once patient is ready to call her. Bedside aware.  Spoke with Aimee with PT. Patient needs rolling walker not Rolator , because seat of the Rolator will be in way. Patient aware and voiced understanding. Walker ordered through [facility]) and will be delivered to room prior to discharge.   Aimee will also call Olin Hauser.   Referral accepted by Joen Laura with [facility] .   Final next level of care: [place] Barriers to Discharge: No Barriers Identified   Patient Goals and CMS Choice Patient states their goals for this hospitalization and ongoing recovery are:: to go home  CMS Medicare.gov Compare Post Acute Care list provided to:: Patient Choice offered to / list presented to : Patient  Discharge Placement                       Discharge Plan and Services   Discharge Planning Services: CM Consult Post Acute Care Choice: Home Health          DME  Arranged: Walker rolling DME Agency: [facility] Arranged: PT [place] Agency: [facility] (formerly [employer])   Social Determinants of Health (SDOH) Interventions     Readmission Risk Interventions No flowsheet data found.

## 2019-03-02 NOTE — Discharge Summary (Signed)
Patient ID: Michelle Vaughan MRN: [id number] DOB/AGE: Mar 18, 1941 78 y.o.  Admit date: 02/27/2019 Discharge date: 03/02/2019  Admission Diagnoses:  Principal Problem:   Tibial plateau fracture, right, closed, initial encounter Active Problems:   Closed fracture of lateral portion of right tibial plateau   Discharge Diagnoses:  Same  Past Medical History:  Diagnosis Date  . 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 Cataracts, bilateral    surgery to remove   . Chronic back pain   . Depression   . Dysrhythmia    occasional  . History of cardiac aneurysm    from PCP in [place]  . Insomnia   . Osteoporosis    no med  . Pulmonary HTN ([place])    Hx from PCP in [place]    Surgeries: Procedure(s): OPEN REDUCTION INTERNAL FIXATION (ORIF) RIGHT TIBIAL PLATEAU FRACTURE on 02/27/2019   Consultants:   Discharged Condition: Improved  Hospital Course: Michelle Vaughan is an 78 y.o. female who was admitted 02/27/2019 for operative treatment ofTibial plateau fracture, right, closed, initial encounter. Patient has severe unremitting pain that affects sleep, daily activities, and work/hobbies. After pre-op clearance the patient was taken to the operating room on 02/27/2019 and underwent  Procedure(s): OPEN REDUCTION INTERNAL FIXATION (ORIF) RIGHT TIBIAL PLATEAU FRACTURE.    Patient was given perioperative antibiotics:  Anti-infectives (From admission, onward)   Start     Dose/Rate Route Frequency Ordered Stop   02/27/19 1700  clindamycin (CLEOCIN) IVPB 600 mg     600 mg 100 mL/hr over 30 Minutes Intravenous Every 6 hours 02/27/19 1624 02/28/19 0828   02/27/19 0600  clindamycin (CLEOCIN) IVPB 900 mg     900 mg 100 mL/hr over 30 Minutes Intravenous On call to O.R. 02/27/19 0545 02/27/19 0819   02/27/19 0546  clindamycin (CLEOCIN) 900 MG/50ML IVPB    Note to Pharmacy:  Merryl Hacker   : cabinet override      02/27/19 0546 02/27/19 0749       Patient was given sequential compression devices, early  ambulation, and chemoprophylaxis to prevent DVT.  Patient benefited maximally from hospital stay and there were no complications.    Recent vital signs:  Patient Vitals for the past 24 hrs:  BP Temp Temp src Pulse Resp SpO2  03/02/19 0549 - - - 68 - -  03/02/19 0548 (!) 144/106 99.3 F (37.4 C) Oral (!) 39 17 97 %  03/01/19 1956 (!) 98/44 98.7 F (37.1 C) Oral 62 19 94 %  03/01/19 1401 (!) 118/50 98 F (36.7 C) Oral 63 (!) 22 96 %     Recent laboratory studies: No results for input(s): WBC, HGB, HCT, PLT, NA, K, CL, CO2, BUN, CREATININE, GLUCOSE, INR, CALCIUM in the last 72 hours.  Invalid input(s): PT, 2   Discharge Medications:   Allergies as of 03/02/2019      Reactions   Penicillins Rash   Did it involve swelling of the face/tongue/throat, SOB, or low BP? No Did it involve sudden or severe rash/hives, skin peeling, or any reaction on the inside of your mouth or nose? Unknown Did you need to seek medical attention at a hospital or doctor's office? No When did it last happen?In her 75s If all above answers are "NO", may proceed with cephalosporin use.   Metronidazole Rash      Medication List    STOP taking these medications   HYDROcodone-acetaminophen 5-325 MG tablet Commonly known as:  NORCO/VICODIN  TAKE these medications   aspirin EC 325 MG tablet Take 1 tablet (325 mg total) by mouth daily.   clonazePAM 0.5 MG tablet Commonly known as:  KLONOPIN Take 0.25 mg by mouth at bedtime.   escitalopram 20 MG tablet Commonly known as:  LEXAPRO Take 20 mg by mouth daily after lunch.   oxyCODONE 5 MG immediate release tablet Commonly known as:  Oxy IR/ROXICODONE Take 1 tablet (5 mg total) by mouth every 6 (six) hours as needed for moderate pain (pain score 4-6). What changed:    when to take this  reasons to take this   rosuvastatin 5 MG tablet Commonly known as:  CRESTOR Take 5 mg by mouth daily.            Durable Medical Equipment  (From  admission, onward)         Start     Ordered   02/27/19 1625  DME Walker rolling  Once    Question:  Patient needs a walker to treat with the following condition  Answer:  Closed fracture of lateral portion of right tibial plateau   02/27/19 1624   02/27/19 1625  DME 3 n 1  Once     02/27/19 1624          Diagnostic Studies: Dg Pelvis 1-2 Views  Result Date: 02/19/2019 CLINICAL DATA:  Fall.  Pain. EXAM: PELVIS - 1-2 VIEW COMPARISON:  None. FINDINGS: There is no evidence of pelvic fracture or diastasis. No pelvic bone lesions are seen. IMPRESSION: Negative. Electronically Signed   By: Staci Righter M.D.   On: 02/19/2019 14:17   Dg Tibia/fibula Right  Result Date: 02/19/2019 CLINICAL DATA:  [place]. Pain EXAM: RIGHT TIBIA AND FIBULA - 2 VIEW COMPARISON:  None. FINDINGS: There is an oblique lateral tibial plateau fracture, minimal depression, seen better on the knee radiograph. Fibula is intact. Distal tibia is intact. IMPRESSION: Oblique lateral tibial plateau fracture, minimal depression. Electronically Signed   By: Staci Righter M.D.   On: 02/19/2019 14:21   Dg Ankle Complete Right  Result Date: 02/19/2019 CLINICAL DATA:  [place].  Pain. EXAM: RIGHT ANKLE - COMPLETE 2+ VIEW COMPARISON:  None. FINDINGS: No lateral could be obtained. There is no evidence of fracture, dislocation, or joint effusion. There is no evidence of arthropathy or other focal bone abnormality. Soft tissues are unremarkable. IMPRESSION: Two-view ankle, without a lateral radiograph, demonstrates no fracture Electronically Signed   By: Staci Righter M.D.   On: 02/19/2019 14:36   Ct Knee Right Wo Contrast  Result Date: 02/19/2019 CLINICAL DATA:  Tibial plateau depression fracture EXAM: CT OF THE RIGHT KNEE WITHOUT CONTRAST TECHNIQUE: Multidetector CT imaging of the right knee was performed according to the standard protocol. Multiplanar CT image reconstructions were also generated. COMPARISON:  No fractures are seen apart from the  comminuted tibial plateau for fracture. FINDINGS: Bones: Bones are diffusely osteoporotic. There is a comminuted fracture of the right lateral tibial plateau. The fracture extends through the midportion of the lateral tibial plateau extending vertically to the level of the proximal metaphysis. There is a horizontal component involving the lateral tibial metaphysis with mild lateral displacement of up to 3 mm. The greatest degree of depression of the comminuted tibial plateau fracture is at the junction of the anterior and mid thirds. There is up to 8 mm of depression in this area. There is mild posterior displacement of a major portion of the comminuted fracture with approximately 3 mm of posterior displacement.  There is impaction in the proximal metaphysis region laterally. Along the more lateral aspect of the fracture, a fracture fragment is seen along the superolateral aspect of the lateral tibial plateau with 3 mm of displacement of this area compared to the remainder of the proximal tibia. There is a nondisplaced fracture of the medial right tibial spine. No other fractures are evident. No dislocation. No appreciable joint effusion. Ligaments Suboptimally assessed by CT. Muscles and Tendons No intramuscular lesions are evident.  No tendon lesions evident. Soft tissues No appreciable soft tissue hematoma or mass evident. IMPRESSION: 1.  Bones diffusely osteoporotic. 2. Comminuted fracture of the lateral tibial plateau with areas of impaction. There is up to 8 mm of depression at the junction of the anterior and posterior thirds of the lateral tibial plateau. There are displaced fracture fragments laterally and posteriorly as noted. 3.  Nondisplaced medial tibial spine fracture. 4.  No dislocation or joint effusion. Electronically Signed   By: Lowella Grip III M.D.   On: 02/19/2019 17:48   Dg Knee Complete 4 Views Right  Result Date: 02/27/2019 CLINICAL DATA:  ORIF for tibial plateau fracture. EXAM:  RIGHT KNEE - COMPLETE 4+ VIEW; DG C-ARM 61-120 MIN COMPARISON:  None FINDINGS: Multiple C-arm images show lateral plate and screw reconstruction of a lateral tibial plateau fracture. Components appear well positioned. No radiographically detectable complication. IMPRESSION: ORIF for lateral tibial plateau fracture Electronically Signed   By: Nelson Chimes M.D.   On: 02/27/2019 10:12   Dg Knee Complete 4 Views Right  Result Date: 02/19/2019 CLINICAL DATA:  [place]. Pain EXAM: RIGHT KNEE - COMPLETE 4+ VIEW COMPARISON:  None. FINDINGS: There is an oblique lateral tibial plateau fracture, minimal depression. Lipohemarthrosis. Medial tibial plateau is intact. No dislocation. Vascular calcification. IMPRESSION: Oblique lateral tibial plateau fracture, minimal depression. Lipohemarthrosis. Electronically Signed   By: Staci Righter M.D.   On: 02/19/2019 14:20   Dg Knee Right Port  Result Date: 02/27/2019 CLINICAL DATA:  Right knee fracture status post ORIF EXAM: PORTABLE RIGHT KNEE - 1-2 VIEW COMPARISON:  CT right knee 02/19/2019 FINDINGS: Lateral tibial plateau fracture transfixed with a lateral sideplate and multiple interlocking screws. Persistent 6 mm depression of the lateral tibial plateau articular surface. No hardware failure or complication. No aggressive osseous lesion. IMPRESSION: Lateral tibial plateau fracture transfixed with a lateral sideplate and multiple interlocking screws. Persistent 6 mm depression of the lateral tibial plateau articular surface. Electronically Signed   By: Kathreen Devoid   On: 02/27/2019 21:01   Dg C-arm 1-60 Min  Result Date: 02/27/2019 CLINICAL DATA:  ORIF for tibial plateau fracture. EXAM: RIGHT KNEE - COMPLETE 4+ VIEW; DG C-ARM 61-120 MIN COMPARISON:  None FINDINGS: Multiple C-arm images show lateral plate and screw reconstruction of a lateral tibial plateau fracture. Components appear well positioned. No radiographically detectable complication. IMPRESSION: ORIF for lateral  tibial plateau fracture Electronically Signed   By: Nelson Chimes M.D.   On: 02/27/2019 10:12   Dg Femur Min 2 Views Right  Result Date: 02/19/2019 CLINICAL DATA:  Fall. Pain. EXAM: RIGHT FEMUR 2 VIEWS COMPARISON:  RIGHT knee reported separately. FINDINGS: There is no evidence of femur fracture or other focal bone lesions. A knee joint effusion, lipohemarthrosis, is present, described separately. IMPRESSION: Negative for femur fracture. Electronically Signed   By: Staci Righter M.D.   On: 02/19/2019 14:18    Disposition: Discharge disposition: 01-Home or Self Care         Follow-up Information  Mcarthur Rossetti, MD. Schedule an appointment as soon as possible for a visit in 2 week(s).   Specialty:  Orthopedic Surgery Contact information: [place] [address] [phone]            Signed: Mcarthur Rossetti 03/02/2019, 7:47 AM

## 2019-03-02 NOTE — Progress Notes (Signed)
Patient stool was disimpacted and she was given a smog enema. Additional hard stools were disimpacted. Patient refused discharge for today.

## 2019-03-02 NOTE — Plan of Care (Signed)
  Problem: Pain Managment: Goal: General experience of comfort will improve Outcome: Progressing   Problem: Safety: Goal: Ability to remain free from injury will improve Outcome: Progressing   Problem: Skin Integrity: Goal: Risk for impaired skin integrity will decrease Outcome: Progressing   

## 2019-03-02 NOTE — Progress Notes (Signed)
Physical Therapy Treatment Patient Details Name: Michelle Vaughan MRN: [id number] DOB: [birth year] Today's Date: 03/02/2019    History of Present Illness Pt is a 78 y/o female s/p ORIF R tibia, NWB R LE. PMH including but not limited to HTN.    PT Comments    Pt performed transfer training and LE strengthening to RLE.  Pt reports decreased pain after exercise in RLE.  Spoke with MD who placed order for [facility] boot for RLE.  Ortho tech to deliver.  Educated patient and daughter on use of splint at night.  Pt issued HEP and HEP digitally messaged to daughter via text.  Plan for HHPT is appropriate and daughter and son in law able to provide support.      Follow Up Recommendations  Home health PT;Supervision/Assistance - 24 hour(if family able to provide adequate assistance)     Equipment Recommendations  Rolling walker with 5" wheels    Recommendations for Other Services       Precautions / Restrictions Restrictions Weight Bearing Restrictions: Yes RLE Weight Bearing: Non weight bearing    Mobility  Bed Mobility Overal bed mobility: Needs Assistance Bed Mobility: Supine to Sit     Supine to sit: Supervision     General bed mobility comments: increased time and effort, assistance with self assist to RLE  Transfers Overall transfer level: Needs assistance Equipment used: Rolling walker (2 wheeled) Transfers: Sit to/from Stand Sit to Stand: Min assist Stand pivot transfers: Mod assist       General transfer comment: Pt require min assistance to boost into standing and moderate assistance to move from bed to [facility] and BSC to recliner chair.    Ambulation/Gait Ambulation/Gait assistance: (pivotal step with LLE from surface to surface, not true step.  )               Stairs             Wheelchair Mobility    Modified Rankin (Stroke Patients Only)       Balance     Sitting balance-Leahy Scale: Fair       Standing balance-Leahy Scale:  Poor Standing balance comment: reliant on bilateral UEs on RW and external assist                            Cognition Arousal/Alertness: Awake/alert Behavior During Therapy: WFL for tasks assessed/performed Overall Cognitive Status: Within Functional Limits for tasks assessed                                 General Comments: cognition WFL; verbal cueing for new techniques throughout session      Exercises General Exercises - Lower Extremity Ankle Circles/Pumps: AAROM;Right;15 reps;Supine Quad Sets: AROM;Right;10 reps;Supine Heel Slides: AAROM;10 reps;Supine;Right Hip ABduction/ADduction: AAROM;Right;10 reps;Supine Straight Leg Raises: AAROM;Supine;Right;10 reps    General Comments        Pertinent Vitals/Pain Pain Assessment: Faces Faces Pain Scale: Hurts little more Pain Location: R LE Pain Descriptors / Indicators: Sore;Spasm Pain Intervention(s): Monitored during session;Repositioned    Home Living                      Prior Function            PT Goals (current goals can now be found in the care plan section) Acute Rehab PT Goals Patient Stated Goal: decrease pain Potential  to Achieve Goals: Good Progress towards PT goals: Progressing toward goals    Frequency    Min 5X/week      PT Plan Current plan remains appropriate    Co-evaluation              AM-PAC PT "6 Clicks" Mobility   Outcome Measure  Help needed turning from your back to your side while in a flat bed without using bedrails?: None Help needed moving from lying on your back to sitting on the side of a flat bed without using bedrails?: None Help needed moving to and from a bed to a chair (including a wheelchair)?: A Little Help needed standing up from a chair using your arms (e.g., wheelchair or bedside chair)?: A Little Help needed to walk in hospital room?: A Lot Help needed climbing 3-5 steps with a railing? : A Lot 6 Click Score: 18    End  of Session Equipment Utilized During Treatment: Gait belt Activity Tolerance: Patient tolerated treatment well Patient left: in chair;with call bell/phone within reach;with chair alarm set Nurse Communication: Mobility status PT Visit Diagnosis: Other abnormalities of gait and mobility (R26.89);Pain Pain - Right/Left: Right Pain - part of body: Leg;Knee     Time: 1010-1033 PT Time Calculation (min) (ACUTE ONLY): 23 min  Charges:  $Therapeutic Exercise: 8-22 mins $Therapeutic Activity: 8-22 mins                     Governor Rooks, PTA Acute Rehabilitation Services Pager [phone] Office [phone]     Brannon Levene Eli Hose 03/02/2019, 1:35 PM

## 2019-03-02 NOTE — Progress Notes (Signed)
Patient ID: Michelle Vaughan, female   DOB: 1941-03-21, 78 y.o.   MRN: [id number] Seems to be doing well overall.  Can be discharged to home this afternoon.  Will consult case management for home health needs.

## 2019-03-02 NOTE — Telephone Encounter (Signed)
I have reviewed outside records (Dr Georgiann Hahn) Echo ordered in Feb  For evaluation of mitrial regurgitation and possible pulmonary HTN   Per his office note, lungs CTA   Murmur present  Pt just discharged today from [facility]   Fractured hip  REcom:   Would recomm echo later in spring   Late may when corona virus pandemic restrictions ease and when pt farther out in rehab from hip surgery

## 2019-03-03 ENCOUNTER — Encounter (HOSPITAL_COMMUNITY): Payer: Self-pay | Admitting: General Practice

## 2019-03-03 ENCOUNTER — Ambulatory Visit (HOSPITAL_COMMUNITY): Payer: Medicare Other

## 2019-03-03 MED ORDER — BISACODYL 10 MG RE SUPP: 10.0000 mg | Freq: Every day | RECTAL | Status: DC | PRN

## 2019-03-03 MED ORDER — MUPIROCIN 2 % EX OINT: 1.0000 "application " | TOPICAL_OINTMENT | Freq: Two times a day (BID) | CUTANEOUS | Status: DC

## 2019-03-03 NOTE — Progress Notes (Signed)
Physical Therapy Treatment Patient Details Name: Michelle Vaughan MRN: [id number] DOB: 22-Sep-1941 Today's Date: 03/03/2019    History of Present Illness Pt is a 78 y/o female s/p ORIF R tibia, NWB R LE. PMH including but not limited to HTN.    PT Comments    Pt performed WC mobility and functional mobility to transfer from surface to surface.  Pt educated on WC parts and use of WC at home.  Pt resting in bed at end of session.  Plan for HHPT remains appropriate and family is willing to provide support.   Follow Up Recommendations  Home health PT;Supervision/Assistance - 24 hour(if family able to provide adequate assistance.  )     Equipment Recommendations  Rolling walker with 5" wheels    Recommendations for Other Services       Precautions / Restrictions Precautions Precautions: Fall Restrictions Weight Bearing Restrictions: Yes RLE Weight Bearing: Non weight bearing    Mobility  Bed Mobility Overal bed mobility: Modified Independent             General bed mobility comments: increased time and effort, assistance with self assist to RLE  Transfers Overall transfer level: Needs assistance Equipment used: Rolling walker (2 wheeled) Transfers: Sit to/from Stand Sit to Stand: Min assist         General transfer comment: Cues for hand placement to and from seated surface.  PTA educated on forward weight shifting to transition weight over strong limb to achieve standing.    Ambulation/Gait Ambulation/Gait assistance: (Continues to pivot from surface to surface no true gait training.  )               Theme park manager mobility: Yes Wheelchair propulsion: Both upper extremities Wheelchair parts: Supervision/cueing Distance: 120 ft min VCs for turing.   Wheelchair Assistance Details (indicate cue type and reason): Assistance to placement of WC to prepare for transfers but once in chair able to  [facility] around nursing unit.    Modified Rankin (Stroke Patients Only)       Balance Overall balance assessment: Needs assistance Sitting-balance support: Feet supported Sitting balance-Leahy Scale: Fair       Standing balance-Leahy Scale: Poor                              Cognition Arousal/Alertness: Awake/alert Behavior During Therapy: WFL for tasks assessed/performed Overall Cognitive Status: Within Functional Limits for tasks assessed Area of Impairment: Following commands;Problem solving                       Following Commands: Follows one step commands with increased time     Problem Solving: Requires verbal cues;Requires tactile cues;Slow processing General Comments: pt needs cues for w/c use and modifications. pt states "i am so glad to meet yall with common sense. you really dont know how great that is "      Exercises      General Comments        Pertinent Vitals/Pain Pain Assessment: No/denies pain Faces Pain Scale: Hurts little more Pain Location: R LE Pain Descriptors / Indicators: Sore;Spasm Pain Intervention(s): Monitored during session;Repositioned    Home Living                      Prior Function  PT Goals (current goals can now be found in the care plan section) Acute Rehab PT Goals Patient Stated Goal: decrease pain Potential to Achieve Goals: Good Progress towards PT goals: Progressing toward goals    Frequency    Min 5X/week      PT Plan Current plan remains appropriate    Co-evaluation              AM-PAC PT "6 Clicks" Mobility   Outcome Measure  Help needed turning from your back to your side while in a flat bed without using bedrails?: None Help needed moving from lying on your back to sitting on the side of a flat bed without using bedrails?: None Help needed moving to and from a bed to a chair (including a wheelchair)?: A Little Help needed standing up from a chair using  your arms (e.g., wheelchair or bedside chair)?: A Little Help needed to walk in hospital room?: A Lot Help needed climbing 3-5 steps with a railing? : A Lot 6 Click Score: 18    End of Session Equipment Utilized During Treatment: Gait belt Activity Tolerance: Patient tolerated treatment well Patient left: with call bell/phone within reach;in bed;with bed alarm set Nurse Communication: Mobility status PT Visit Diagnosis: Other abnormalities of gait and mobility (R26.89);Pain Pain - Right/Left: Right Pain - part of body: Leg;Knee     Time: 3709-6438 PT Time Calculation (min) (ACUTE ONLY): 24 min  Charges:  $Therapeutic Activity: 23-37 mins                     Governor Rooks, PTA Acute Rehabilitation Services Pager [phone] Office [phone]     Michelle Vaughan 03/03/2019, 5:34 PM

## 2019-03-03 NOTE — Progress Notes (Signed)
Provided discharge education/instructions, home med picked up from pharmacy and handed to Pt, all questions and concerns addressed, Pt not in distress, Pt to discharge home with belongings.

## 2019-03-03 NOTE — Plan of Care (Signed)
°  Problem: Elimination: °Goal: Will not experience complications related to bowel motility °Outcome: Progressing °  °Problem: Pain Managment: °Goal: General experience of comfort will improve °Outcome: Progressing °  °

## 2019-03-03 NOTE — Progress Notes (Signed)
Patient ID: Michelle Vaughan, female   DOB: December 27, 1940, 78 y.o.   MRN: [id number] The patient stayed an extra day due to significant constipation.  Doing well today.  Can be discharged to home today.

## 2019-03-03 NOTE — Discharge Instructions (Signed)
Expect right knee, leg, and foot swelling - ice and elevation as needed. Pump your feet several times throughout the day. You can wear compression stockings on your legs if swelling persists. Non-weight bearing on right leg. Can leave current dressing in place for the next 5-6 days. Can get actual incision wet in 5-6 days.  Do take an over-the-counter stool softener twice daily such as Colace or Myralax. If constipation gets bad, try over-the-counter mag citrate.

## 2019-03-03 NOTE — Plan of Care (Signed)
  Problem: Clinical Measurements: Goal: Ability to maintain clinical measurements within normal limits will improve Outcome: Progressing   Problem: Activity: Goal: Risk for activity intolerance will decrease Outcome: Progressing   Problem: Nutrition: Goal: Adequate nutrition will be maintained Outcome: Progressing   Problem: Elimination: Goal: Will not experience complications related to bowel motility Outcome: Progressing   Problem: Pain Managment: Goal: General experience of comfort will improve Outcome: Progressing   Problem: Safety: Goal: Ability to remain free from injury will improve Outcome: Progressing   

## 2019-03-03 NOTE — Progress Notes (Signed)
Occupational Therapy Treatment Patient Details Name: Michelle Vaughan MRN: [id number] DOB: 22-Nov-1940 Today's Date: 03/03/2019    History of present illness Pt is a 78 y/o female s/p ORIF R tibia, NWB R LE. PMH including but not limited to HTN.   OT comments  Pt plans to have son and his 10 children (A) upon d/c home. Pt has decided that w/c is the proper fit after much discussion this session. The w/c is a rental that was used prior to admission and will be required to be returned by family. Pt demonstrates ability to dress herself supine in bed or eob without any AE required. Pt educated on safety with w/c and how to take off brake handles to allow clearance into the bathroom door space.   Follow Up Recommendations  Home health OT;Supervision/Assistance - 24 hour    Equipment Recommendations  None recommended by OT    Recommendations for Other Services      Precautions / Restrictions Precautions Precautions: Fall Restrictions RLE Weight Bearing: Non weight bearing       Mobility Bed Mobility Overal bed mobility: Modified Independent                Transfers                 General transfer comment: observed with PTA --see PTA Aimee note    Balance                                           ADL either performed or assessed with clinical judgement   ADL Overall ADL's : Needs assistance/impaired                     Lower Body Dressing: Modified independent Lower Body Dressing Details (indicate cue type and reason): eob and able to don by laying back into supine to pull up pants with lateral lean               General ADL Comments: pt complete dressing for home at eob. pt educated on w/c and management. pt expressing chair is too small. Ot calling [facility] regarding chair and discovered its a rental under son's name. pt advised that son would have to return chair to get bigger size after discharge. Pt transferring  to chair with PTA Aimee. pt educated on removing handles to brakes to fit into bathroom space. pt educated on pillow use iwth elevating leg rest if needed to provide more support. pt educated that a wider w/c coudl make it harder to reach wheel handles to self propell. pt state "all those are great points . maybe i should keep this one"     Vision       Perception     Praxis      Cognition Arousal/Alertness: Awake/alert Behavior During Therapy: WFL for tasks assessed/performed Overall Cognitive Status: Within Functional Limits for tasks assessed                                 General Comments: pt needs cues for w/c use and modifications. pt states "i am so glad to meet yall with common sense. you really dont know how great that is "        Exercises     Shoulder Instructions  General Comments      Pertinent Vitals/ Pain       Pain Assessment: No/denies pain  Home Living                                          Prior Functioning/Environment              Frequency  Min 2X/week        Progress Toward Goals  OT Goals(current goals can now be found in the care plan section)  Progress towards OT goals: Progressing toward goals  Acute Rehab OT Goals Patient Stated Goal: decrease pain OT Goal Formulation: With patient Time For Goal Achievement: 03/14/19 Potential to Achieve Goals: Good ADL Goals Pt Will Perform Grooming: with modified independence;sitting Pt Will Perform Lower Body Bathing: with adaptive equipment;sit to/from stand;with supervision Pt Will Perform Lower Body Dressing: with supervision;sit to/from stand;with adaptive equipment Pt Will Transfer to Toilet: with min assist;bedside commode;stand pivot transfer Pt Will Perform Toileting - Clothing Manipulation and hygiene: with supervision;sitting/lateral leans;sit to/from stand Pt Will Perform Tub/Shower Transfer: Shower transfer;3 in 1;with supervision;rolling  walker;Stand pivot transfer Pt/caregiver will Perform Home Exercise Program: Increased strength;Both right and left upper extremity;With theraband;With written HEP provided  Plan Discharge plan remains appropriate;Frequency remains appropriate    Co-evaluation                 AM-PAC OT "6 Clicks" Daily Activity     Outcome Measure   Help from another person eating meals?: None Help from another person taking care of personal grooming?: None Help from another person toileting, which includes using toliet, bedpan, or urinal?: A Lot Help from another person bathing (including washing, rinsing, drying)?: A Little Help from another person to put on and taking off regular upper body clothing?: None Help from another person to put on and taking off regular lower body clothing?: A Lot 6 Click Score: 19    End of Session    OT Visit Diagnosis: Other abnormalities of gait and mobility (R26.89);Pain Pain - Right/Left: Right Pain - part of body: Leg   Activity Tolerance Patient tolerated treatment well   Patient Left in bed;with call bell/phone within reach   Nurse Communication Mobility status;Precautions;Weight bearing status        Time: 6301-6010 OT Time Calculation (min): 23 min  Charges: OT General Charges $OT Visit: 1 Visit OT Treatments $Self Care/Home Management : 23-37 mins   Jeri Modena, OTR/L  Acute Rehabilitation Services Pager: [phone] Office: [phone] .    Jeri Modena 03/03/2019, 2:13 PM

## 2019-03-16 ENCOUNTER — Telehealth: Payer: Self-pay | Admitting: Orthopaedic Surgery

## 2019-03-16 ENCOUNTER — Ambulatory Visit (INDEPENDENT_AMBULATORY_CARE_PROVIDER_SITE_OTHER): Payer: Medicare Other

## 2019-03-16 ENCOUNTER — Encounter: Payer: Self-pay | Admitting: Orthopaedic Surgery

## 2019-03-16 ENCOUNTER — Ambulatory Visit (INDEPENDENT_AMBULATORY_CARE_PROVIDER_SITE_OTHER): Payer: Medicare Other | Admitting: Orthopaedic Surgery

## 2019-03-16 ENCOUNTER — Other Ambulatory Visit: Payer: Self-pay

## 2019-03-16 DIAGNOSIS — S82141A Displaced bicondylar fracture of right tibia, initial encounter for closed fracture: Secondary | ICD-10-CM | POA: Diagnosis not present

## 2019-03-16 NOTE — Telephone Encounter (Signed)
FYI

## 2019-03-16 NOTE — Telephone Encounter (Signed)
Clare Gandy called to let Dr. Beryle Beams know that pt missed to pt appointments.  [phone] tom is with [facility]

## 2019-03-16 NOTE — Progress Notes (Signed)
Office Visit Note   Patient: Michelle Vaughan           Date of Birth: [birth year]           MRN: [id number] Visit Date: 03/16/2019              Requested by: Marton Redwood, MD [address] [place], [place] [address] PCP: Marton Redwood, MD   Assessment & Plan: Visit Diagnoses:  1. Tibial plateau fracture, right, closed, initial encounter     Plan: She is now touchdown weightbearing on the right lower extremity for transfers.  She will continue work with therapy on range of motion strengthening the knee.  Regards to the surgical incision she is able to get incision wet.  Sutures removed today Steri-Strips applied.  The mid incision had slight blistering therefore Steri-Strips were not applied.  She will return sooner if there is any questions complications or problems with the incision.  Follow-Up Instructions: Return in about 4 weeks (around 04/13/2019) for Radiographs.   Orders:  Orders Placed This Encounter  Procedures   XR Knee 1-2 Views Right   No orders of the defined types were placed in this encounter.     Procedures: No procedures performed   Clinical Data: No additional findings.   Subjective: Chief Complaint  Patient presents with   Right Leg - Follow-up, Routine Post Op    HPI Michelle Vaughan returns today 2 weeks status post open reduction internal fixation of the lateral tibial plateau fracture right knee.  She is overall doing well.  She is been working with therapy on range of motion.  She is been nonweightbearing.  She is had no fevers chills shortness of breath chest pain or calf pain. Review of Systems See HPI otherwise negative  Objective: Vital Signs: LMP  (LMP Unknown)   Physical Exam Constitutional:      Appearance: She is not ill-appearing or diaphoretic.  Neurological:     Mental Status: She is alert and oriented to person, place, and time.     Ortho Exam Right knee surgical incisions healing well.  Well approximated with interrupted  nylon sutures.  No signs of wound dehiscence.  Calf supple nontender.  She has full extension of the knee flexion to approximately 90 degrees.  Dorsiflexion plantarflexion ankle intact.  Specialty Comments:  No specialty comments available.  Imaging: Xr Knee 1-2 Views Right  Result Date: 03/16/2019 AP lateral views: Right knee status post open reduction internal fixation lateral tibial plateau fracture.  No change in overall position alignment.  No hardware failure.  No other fractures identified.    PMFS History: Patient Active Problem List   Diagnosis Date Noted   Closed fracture of lateral portion of right tibial plateau 02/27/2019   Tibial plateau fracture, right, closed, initial encounter 02/23/2019   Past Medical History:  Diagnosis Date   Arthritis    hands - no meds   Cataracts, bilateral    surgery to remove    Chronic back pain    Depression    Dysrhythmia    occasional   History of cardiac aneurysm    from PCP in [place]   Insomnia    Osteoporosis    no med   Pulmonary HTN ([place])    Hx from PCP in [place]    No family history on file.  Past Surgical History:  Procedure Laterality Date   DILATION AND CURETTAGE OF UTERUS     x 3   EYE SURGERY  cataract eye surgery - Bilateral   ORIF TIBIA PLATEAU Right 02/27/2019   Procedure: OPEN REDUCTION INTERNAL FIXATION (ORIF) RIGHT TIBIAL PLATEAU FRACTURE;  Surgeon: Mcarthur Rossetti, MD;  Location: [facility];  Service: Orthopedics;  Laterality: Right;   TONSILLECTOMY     Social History   Occupational History   Not on file  Tobacco Use   Smoking status: Former Smoker    Packs/day: 0.50    Years: 57.00    Pack years: 28.50    Types: Cigarettes    Last attempt to quit: 02/11/2015    Years since quitting: 4.0   Smokeless tobacco: Never Used  Substance and Sexual Activity   Alcohol use: Not Currently   Drug use: Never   Sexual activity: Not on file

## 2019-03-17 ENCOUNTER — Telehealth: Payer: Self-pay | Admitting: Orthopaedic Surgery

## 2019-03-17 NOTE — Telephone Encounter (Signed)
Please advise. Thanks.  

## 2019-03-17 NOTE — Telephone Encounter (Signed)
Patient called advised she is constipated and want to know what she can do to get relief? Patient also want to know what the soft boot is for that she got from the hospital. The number to contact patient is [phone]

## 2019-03-18 NOTE — Telephone Encounter (Signed)
She needs to try colace or sennokt over-the-counter stool softeners.  She can also try Myralax.  Worst case would be over the counter mag citrate.

## 2019-03-18 NOTE — Telephone Encounter (Signed)
It was called a Prafo boot.  It should have gone home with her.

## 2019-03-18 NOTE — Telephone Encounter (Signed)
She wants to know what the soft gray boot that was put on her foot is for. Can you please ask Dr Ninfa Linden and call her back.

## 2019-03-18 NOTE — Telephone Encounter (Signed)
IC advised per Dr Ninfa Linden

## 2019-03-18 NOTE — Telephone Encounter (Signed)
See below

## 2019-03-18 NOTE — Telephone Encounter (Signed)
Patient aware of the below message  

## 2019-04-03 ENCOUNTER — Encounter: Payer: Self-pay | Admitting: Internal Medicine

## 2019-04-14 ENCOUNTER — Telehealth: Payer: Self-pay

## 2019-04-14 NOTE — Telephone Encounter (Signed)
Patient aware this is fine, we actually got xrays at that time and we can go over those with her

## 2019-04-14 NOTE — Telephone Encounter (Signed)
Pt left vm. She has appt on Friday for a 1 mo f/u but has been having ankle pain since injury and wants to know if this can be addressed on Friday as well

## 2019-04-15 ENCOUNTER — Other Ambulatory Visit: Payer: Self-pay

## 2019-04-15 ENCOUNTER — Ambulatory Visit (HOSPITAL_COMMUNITY): Payer: Medicare Other

## 2019-04-15 ENCOUNTER — Ambulatory Visit (HOSPITAL_COMMUNITY)
Admission: RE | Admit: 2019-04-15 | Discharge: 2019-04-15 | Disposition: A | Discharge status: Home / Self Care | Payer: Medicare Other | Source: Ambulatory Visit | Attending: Internal Medicine | Admitting: Internal Medicine

## 2019-04-15 DIAGNOSIS — Z87891 Personal history of nicotine dependence: Secondary | ICD-10-CM | POA: Insufficient documentation

## 2019-04-15 DIAGNOSIS — I340 Nonrheumatic mitral (valve) insufficiency: Secondary | ICD-10-CM | POA: Insufficient documentation

## 2019-04-15 NOTE — Progress Notes (Signed)
  Echocardiogram 2D Echocardiogram has been performed.  Michelle Vaughan 04/15/2019, 3:44 PM

## 2019-04-16 ENCOUNTER — Ambulatory Visit (INDEPENDENT_AMBULATORY_CARE_PROVIDER_SITE_OTHER): Payer: Medicare Other

## 2019-04-16 ENCOUNTER — Ambulatory Visit: Payer: Self-pay

## 2019-04-16 ENCOUNTER — Encounter: Payer: Self-pay | Admitting: Orthopaedic Surgery

## 2019-04-16 ENCOUNTER — Ambulatory Visit (INDEPENDENT_AMBULATORY_CARE_PROVIDER_SITE_OTHER): Payer: Medicare Other | Admitting: Orthopaedic Surgery

## 2019-04-16 DIAGNOSIS — M25571 Pain in right ankle and joints of right foot: Secondary | ICD-10-CM

## 2019-04-16 DIAGNOSIS — M25561 Pain in right knee: Secondary | ICD-10-CM

## 2019-04-16 DIAGNOSIS — S82121D Displaced fracture of lateral condyle of right tibia, subsequent encounter for closed fracture with routine healing: Secondary | ICD-10-CM

## 2019-04-16 NOTE — Progress Notes (Signed)
The patient is now 48 days status post open reduction-in her fixation of a right lateral tibial plateau fracture.  She is 78 years old we just have her touchdown weightbearing only.  She is doing well overall and only reports right ankle pain more than knee pain.  On examination of her right knee she does have valgus of a malalignment of her knees.  This is equal on both sides.  She has excellent range of motion of her right knee and her incisions healed nicely.  Her right ankle is painful on exam but is ligamentously stable.  There is no swelling.  She does have weak dorsiflexion of her foot but she can dorsiflex her right foot.  X-rays of the right knee are obtained and show intact hardware from fixation of the lateral tibial plateau fracture.  There is no evidence of hardware failure loosening and the joint space is well-maintained.  There is been interval healing.  3 views of her right ankle reviewed and show no evidence of a fracture or acute injury.  The alignment is well-maintained.  At this point I given her notes for her home therapist to work on full weightbearing as tolerated on her right lower extremity they can work on balance and coordination and transition from hopefully a walker to a cane over the next 4 weeks as they continue to work with her.  We see her back in 4 weeks from now we will get a final 2 views of the right knee and these can be done supine.

## 2019-04-17 ENCOUNTER — Telehealth: Payer: Self-pay | Admitting: Orthopaedic Surgery

## 2019-04-17 NOTE — Telephone Encounter (Signed)
I called Tom. He found progress note and knows patient is WBAT.

## 2019-04-17 NOTE — Telephone Encounter (Signed)
[facility]/PT called wanting to know if there is a change in weigh bearing since last visit.  Please call Gershon Mussel [phone]

## 2019-04-28 ENCOUNTER — Other Ambulatory Visit: Payer: Self-pay | Admitting: Internal Medicine

## 2019-05-01 ENCOUNTER — Other Ambulatory Visit: Payer: Self-pay | Admitting: Internal Medicine

## 2019-05-01 DIAGNOSIS — I712 Thoracic aortic aneurysm, without rupture, unspecified: Secondary | ICD-10-CM

## 2019-05-14 ENCOUNTER — Other Ambulatory Visit: Payer: Self-pay

## 2019-05-14 ENCOUNTER — Ambulatory Visit: Payer: Medicare Other

## 2019-05-14 ENCOUNTER — Ambulatory Visit (INDEPENDENT_AMBULATORY_CARE_PROVIDER_SITE_OTHER): Payer: Medicare Other | Admitting: Orthopaedic Surgery

## 2019-05-14 ENCOUNTER — Encounter: Payer: Self-pay | Admitting: Orthopaedic Surgery

## 2019-05-14 DIAGNOSIS — Z8781 Personal history of (healed) traumatic fracture: Secondary | ICD-10-CM

## 2019-05-14 DIAGNOSIS — Z9889 Other specified postprocedural states: Secondary | ICD-10-CM

## 2019-05-14 DIAGNOSIS — S82121D Displaced fracture of lateral condyle of right tibia, subsequent encounter for closed fracture with routine healing: Secondary | ICD-10-CM | POA: Diagnosis not present

## 2019-05-14 NOTE — Progress Notes (Signed)
The patient is now 2 and half months status post open reduction-internal fixation of a right lateral tibial plateau fracture.  She is ambulating with a walker.  She does not use a walker or cane before her injury.  She feels like she is doing excellently.  On exam her incisions well-healed laterally.  She has chronic valgus malalignment of that knee but excellent and full range of motion.  The knee feels ligamentously stable.  She is got improved quad strength.  2 views of the right knee show a healed tibial plateau fracture with intact hardware.  At this point she can transition to a cane when she is comfortable.  I have given notes to hopefully extend her home health therapy for 4 more weeks in light of the coronavirus pandemic and her advanced age I would rather her have therapy at home the going to outpatient therapy.  I do feel that they would be able to then help increase her functional mobility back to her baseline.  From my standpoint I do not need to see her back since she is doing so well unless there is any issues at all.  She knows to let me know.  All question concerns were answered and addressed.

## 2019-05-19 ENCOUNTER — Other Ambulatory Visit: Payer: Medicare Other

## 2019-12-01 ENCOUNTER — Ambulatory Visit: Payer: Medicare Other | Attending: Internal Medicine

## 2019-12-01 DIAGNOSIS — Z23 Encounter for immunization: Secondary | ICD-10-CM | POA: Insufficient documentation

## 2019-12-01 NOTE — Progress Notes (Signed)
   Covid-19 Vaccination Clinic  Name:  Michelle Vaughan    MRN: [id number] DOB: 1941/06/22  12/01/2019  [name] was observed post Covid-19 immunization for 15 minutes without incidence. She was provided with Vaccine Information Sheet and instruction to access the V-Safe system.   [name] was instructed to call 911 with any severe reactions post vaccine: Marland Kitchen Difficulty breathing  . Swelling of your face and throat  . A fast heartbeat  . A bad rash all over your body  . Dizziness and weakness    Immunizations Administered    Name Date Dose VIS Date Route   Pfizer COVID-19 Vaccine 12/01/2019  1:52 PM 0.3 mL 10/23/2019 Intramuscular   Manufacturer: [employer], [employer]   Lot: [id number]   [place]: [id number]

## 2019-12-20 ENCOUNTER — Ambulatory Visit: Payer: Medicare Other

## 2019-12-21 ENCOUNTER — Ambulatory Visit: Payer: Medicare Other | Attending: Internal Medicine

## 2019-12-21 DIAGNOSIS — Z23 Encounter for immunization: Secondary | ICD-10-CM | POA: Insufficient documentation

## 2019-12-21 NOTE — Progress Notes (Signed)
   Covid-19 Vaccination Clinic  Name:  JANDRA ARVIDSON    MRN: [id number] DOB: 12/29/1940  12/21/2019  Ms. Samuelsen was observed post Covid-19 immunization for 15 minutes without incidence. She was provided with Vaccine Information Sheet and instruction to access the V-Safe system.   Ms. Stockbridge was instructed to call 911 with any severe reactions post vaccine: Marland Kitchen Difficulty breathing  . Swelling of your face and throat  . A fast heartbeat  . A bad rash all over your body  . Dizziness and weakness    Immunizations Administered    Name Date Dose VIS Date Route   Pfizer COVID-19 Vaccine 12/21/2019  2:26 PM 0.3 mL 10/23/2019 Intramuscular   Manufacturer: [employer]   Lot: [id number]   [place]: [id number]

## 2020-02-05 ENCOUNTER — Other Ambulatory Visit (HOSPITAL_COMMUNITY): Payer: Self-pay | Admitting: Internal Medicine

## 2020-02-05 ENCOUNTER — Other Ambulatory Visit: Payer: Self-pay | Admitting: Internal Medicine

## 2020-02-05 DIAGNOSIS — I712 Thoracic aortic aneurysm, without rupture, unspecified: Secondary | ICD-10-CM

## 2020-02-05 DIAGNOSIS — I340 Nonrheumatic mitral (valve) insufficiency: Secondary | ICD-10-CM

## 2020-02-16 ENCOUNTER — Encounter (HOSPITAL_COMMUNITY): Payer: Self-pay | Admitting: Internal Medicine

## 2020-02-24 ENCOUNTER — Other Ambulatory Visit: Payer: Medicare Other

## 2020-02-25 ENCOUNTER — Inpatient Hospital Stay: Admission: RE | Admit: 2020-02-25 | Payer: Medicare Other | Source: Ambulatory Visit

## 2020-02-25 ENCOUNTER — Other Ambulatory Visit: Payer: Self-pay | Admitting: Internal Medicine

## 2020-02-25 DIAGNOSIS — I712 Thoracic aortic aneurysm, without rupture, unspecified: Secondary | ICD-10-CM

## 2020-02-26 ENCOUNTER — Telehealth (HOSPITAL_COMMUNITY): Payer: Self-pay | Admitting: Radiology

## 2020-02-26 NOTE — Telephone Encounter (Signed)
Just an FYI. We have made several attempts to contact this patient including sending a letter to schedule or reschedule their echocardiogram. We will be removing the patient from the echo [place].  02/16/20 Mailed letter [facility]  02/12/20 [facility] to schedule @ 10:36/[facility]  02/08/20 [facility] to schedule 2 11:17/[facility]  02/05/20 [facility] to schedule @ 02:29 /[facility]  Thank you

## 2020-03-11 ENCOUNTER — Ambulatory Visit
Admission: RE | Admit: 2020-03-11 | Discharge: 2020-03-11 | Disposition: A | Discharge status: Home / Self Care | Payer: Medicare Other | Source: Ambulatory Visit | Attending: Internal Medicine | Admitting: Internal Medicine

## 2020-03-11 DIAGNOSIS — I712 Thoracic aortic aneurysm, without rupture, unspecified: Secondary | ICD-10-CM

## 2020-03-11 MED ORDER — IOPAMIDOL (ISOVUE-370) INJECTION 76%
75.0000 mL | Freq: Once | INTRAVENOUS | Status: AC | PRN
  Administered 2020-03-11: 75 mL via INTRAVENOUS

## 2020-04-02 IMAGING — DX RIGHT FEMUR 2 VIEWS
5 series · 5 of 5 positions shown · non-contrast
Comparison: RIGHT knee reported separately.

CLINICAL DATA: Fall. Pain.

EXAM:
RIGHT FEMUR 2 VIEWS

[femur ap (1 of 2)]
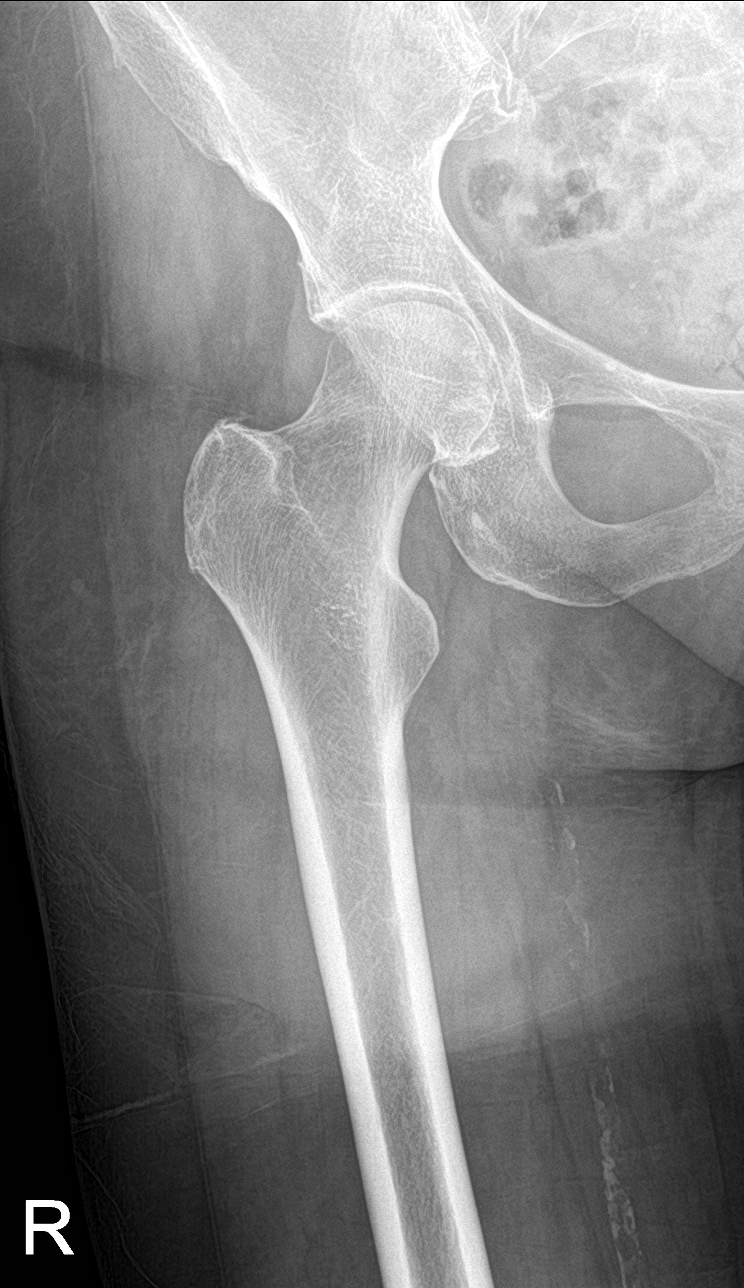

[femur ap (2 of 2)]
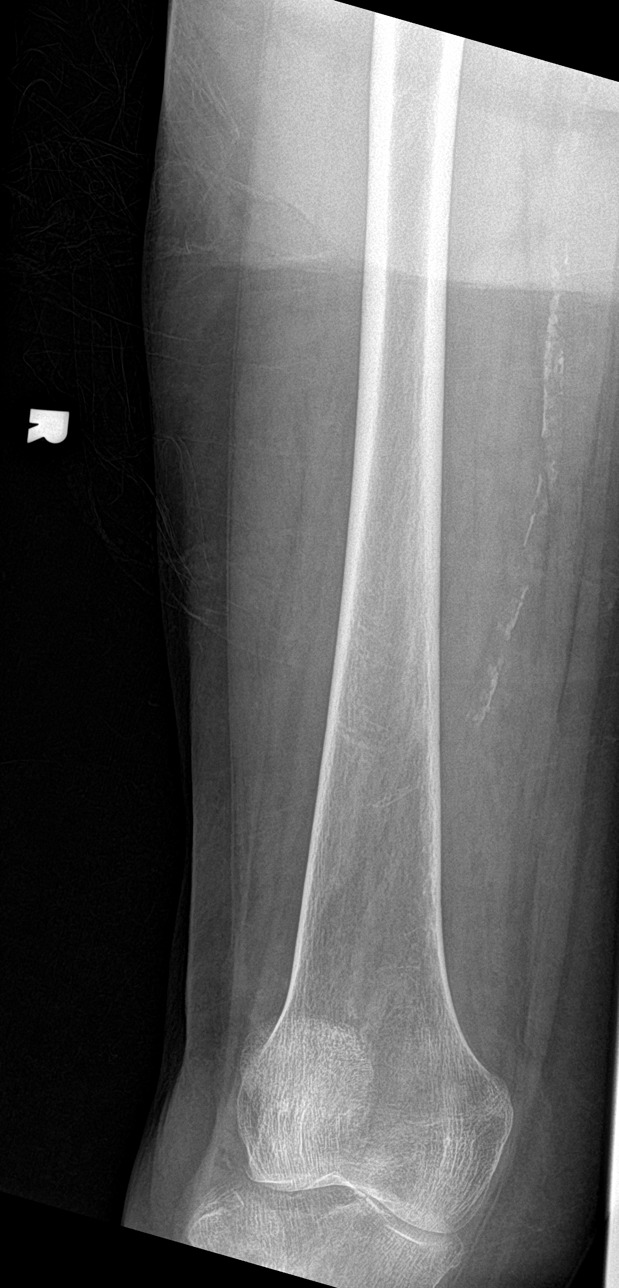

[femur lat (1 of 3)]
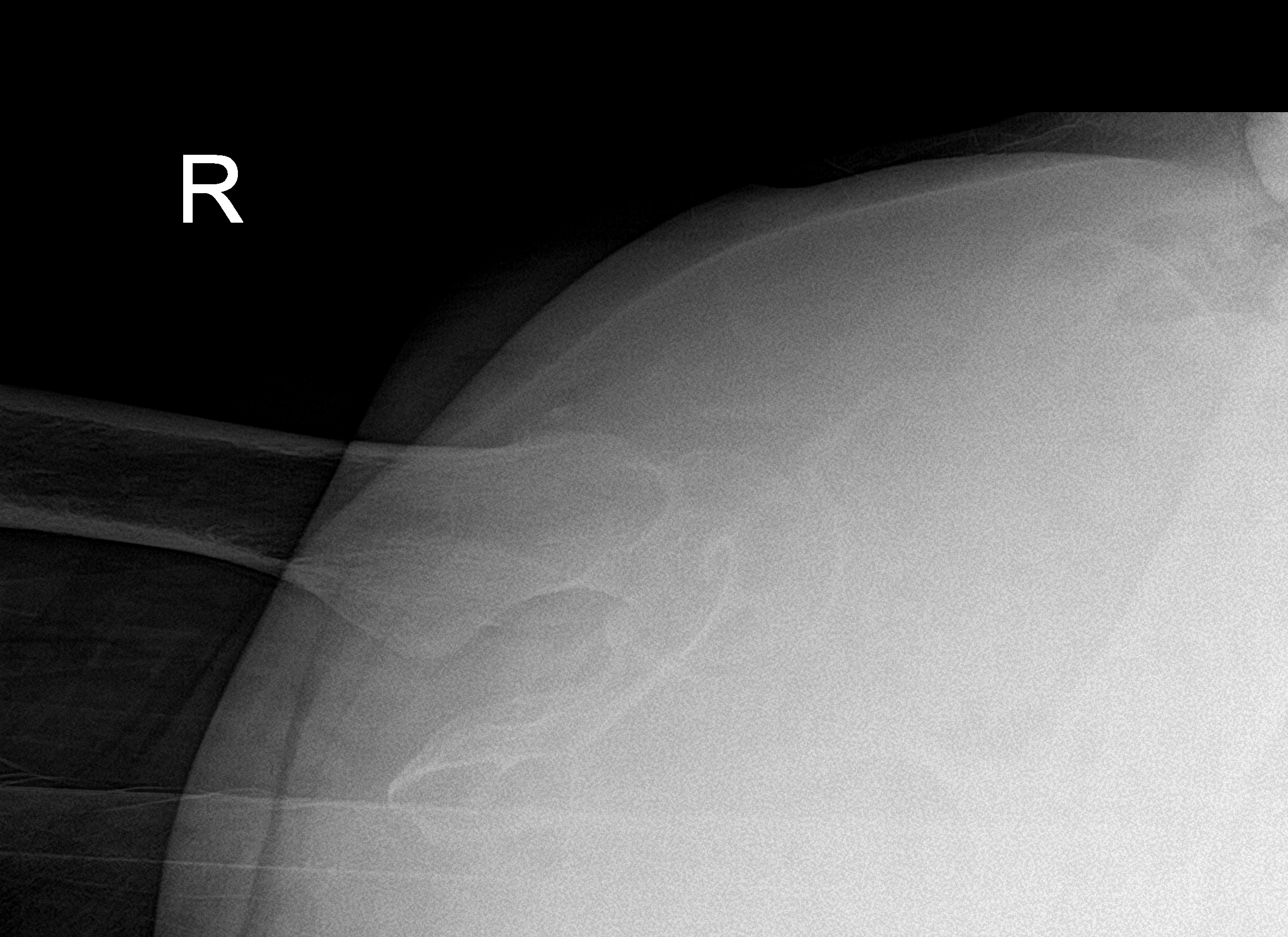

[femur lat (2 of 3)]
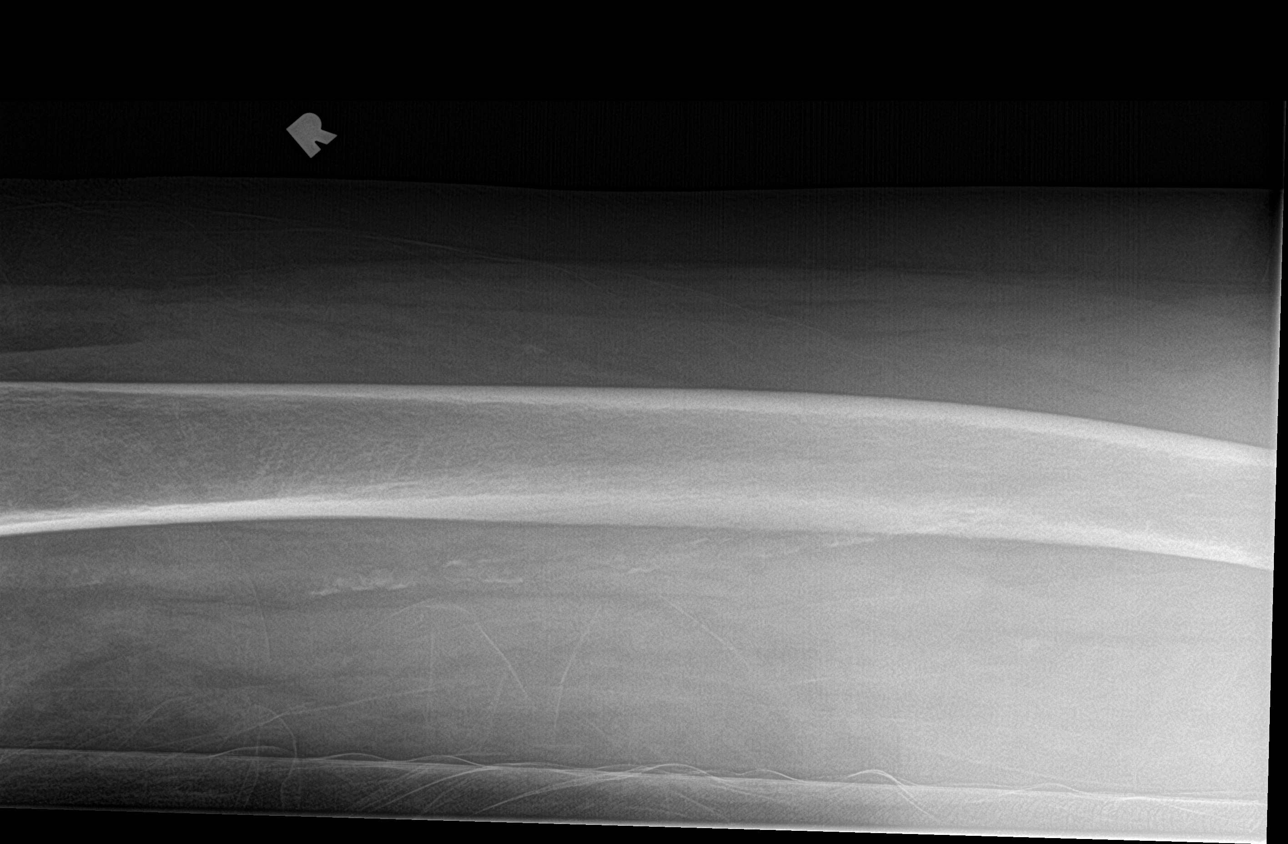

[femur lat (3 of 3)]
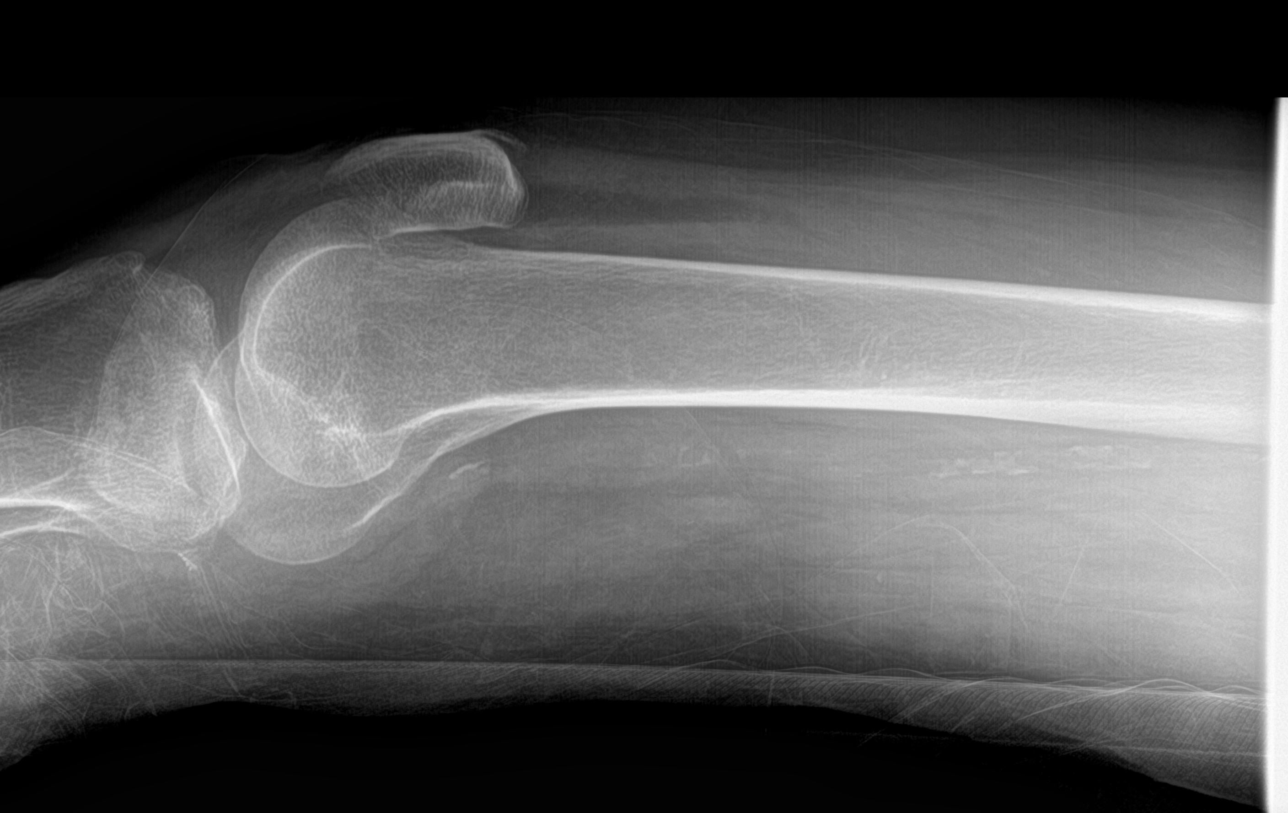

[5 of 5 positions shown; findings below may reference images not displayed]

FINDINGS: There is no evidence of femur fracture or other focal bone lesions.
A knee joint effusion, lipohemarthrosis, is present, described
separately.
IMPRESSION: Negative for femur fracture.

## 2020-04-02 IMAGING — DX PELVIS - 1-2 VIEW
1 series · 1 of 1 positions shown · non-contrast
Comparison: None.

CLINICAL DATA: Fall.  Pain.

EXAM:
PELVIS - 1-2 VIEW

[pelvis ap]
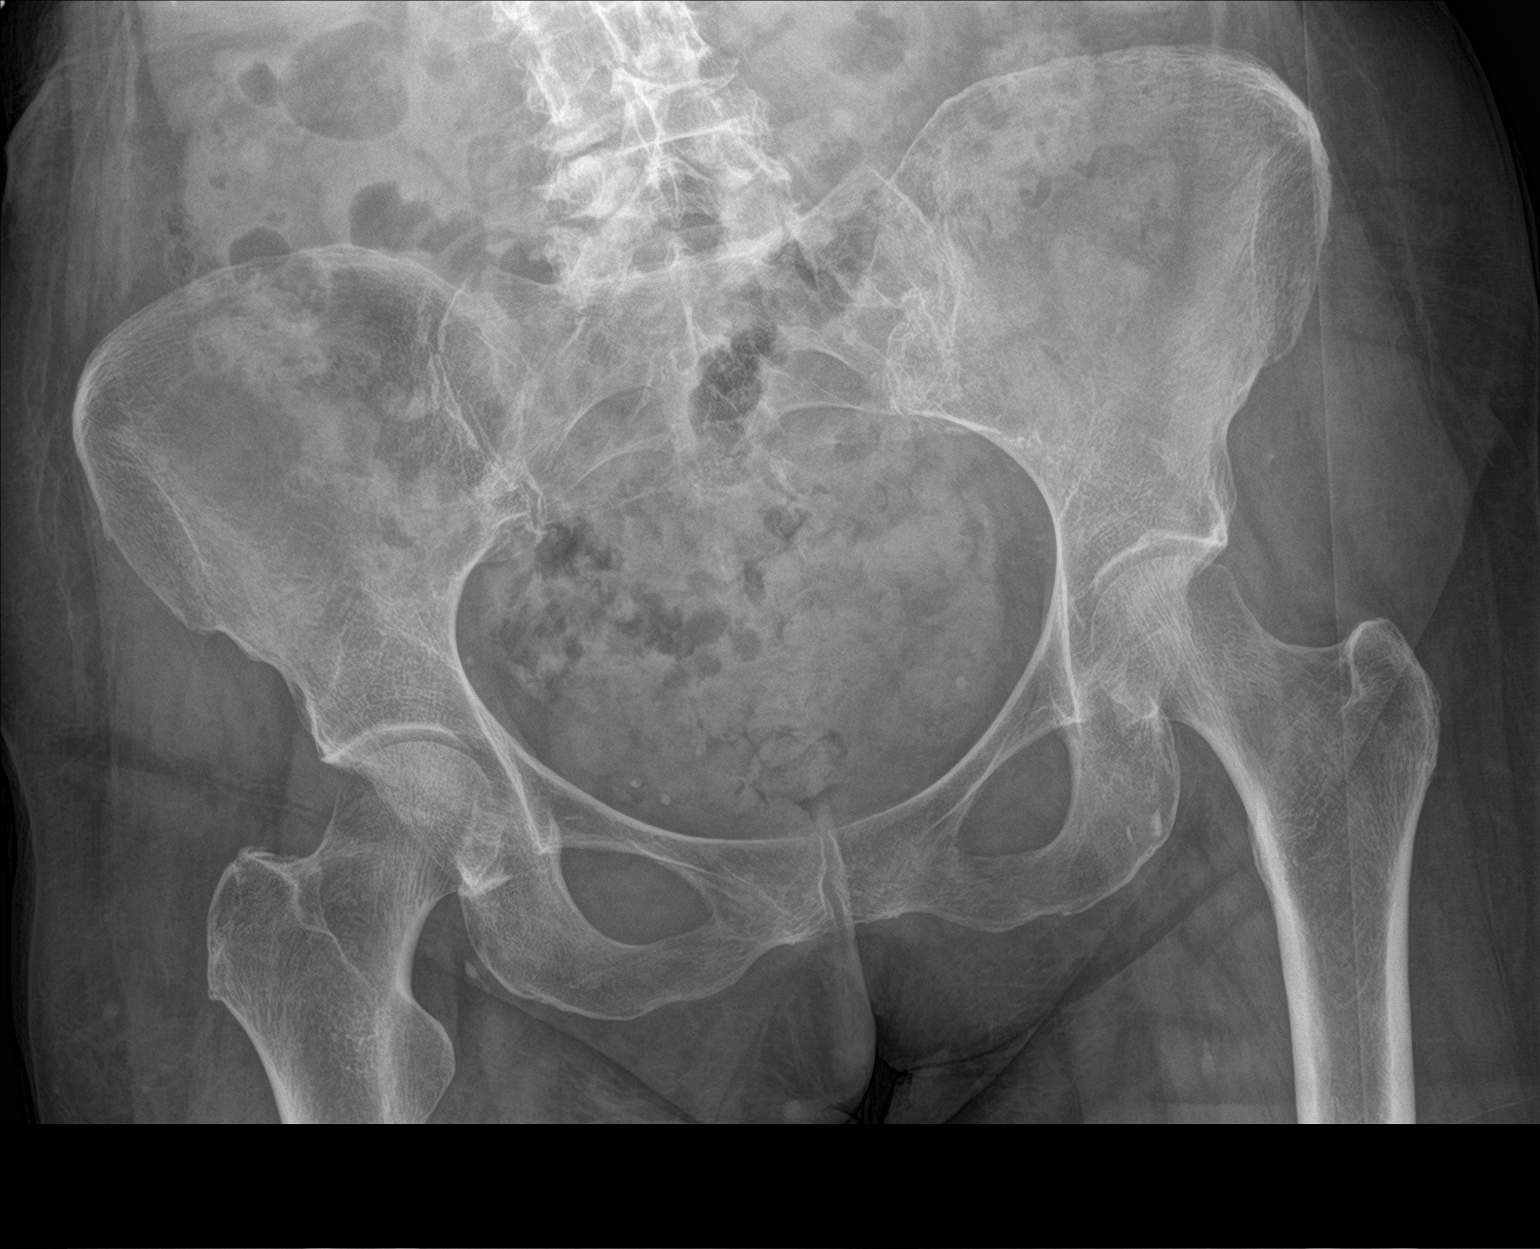

[1 of 1 positions shown; findings below may reference images not displayed]

FINDINGS: There is no evidence of pelvic fracture or diastasis. No pelvic bone
lesions are seen.
IMPRESSION: Negative.

## 2020-05-17 ENCOUNTER — Ambulatory Visit (INDEPENDENT_AMBULATORY_CARE_PROVIDER_SITE_OTHER): Payer: Medicare Other | Admitting: Family Medicine

## 2020-05-17 ENCOUNTER — Other Ambulatory Visit: Payer: Self-pay

## 2020-05-17 ENCOUNTER — Encounter: Payer: Self-pay | Admitting: Family Medicine

## 2020-05-17 VITALS — BP 125/77 | HR 71 | Ht 62.0 in | Wt 116.4 lb

## 2020-05-17 DIAGNOSIS — E785 Hyperlipidemia, unspecified: Secondary | ICD-10-CM

## 2020-05-17 DIAGNOSIS — M542 Cervicalgia: Secondary | ICD-10-CM | POA: Diagnosis not present

## 2020-05-17 DIAGNOSIS — F4310 Post-traumatic stress disorder, unspecified: Secondary | ICD-10-CM | POA: Diagnosis not present

## 2020-05-17 DIAGNOSIS — M545 Low back pain, unspecified: Secondary | ICD-10-CM | POA: Insufficient documentation

## 2020-05-17 DIAGNOSIS — R300 Dysuria: Secondary | ICD-10-CM

## 2020-05-17 DIAGNOSIS — M810 Age-related osteoporosis without current pathological fracture: Secondary | ICD-10-CM | POA: Insufficient documentation

## 2020-05-17 DIAGNOSIS — G8929 Other chronic pain: Secondary | ICD-10-CM

## 2020-05-17 MED ORDER — CLONAZEPAM 0.5 MG PO TABS: 0.2500 mg | ORAL_TABLET | Freq: Every evening | ORAL | 1 refills | Status: DC | PRN

## 2020-05-17 MED ORDER — ROSUVASTATIN CALCIUM 5 MG PO TABS: 5.0000 mg | ORAL_TABLET | Freq: Every day | ORAL | 1 refills | Status: DC

## 2020-05-17 MED ORDER — ESCITALOPRAM OXALATE 20 MG PO TABS: 20.0000 mg | ORAL_TABLET | Freq: Every day | ORAL | 1 refills | Status: DC
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
## 2020-05-17 NOTE — Addendum Note (Signed)
Addended by: Hortencia Pilar on: 05/17/2020 01:56 PM   Modules accepted: Orders

## 2020-05-18 LAB — URINE CULTURE
MICRO NUMBER:: 10670454
SPECIMEN QUALITY:: ADEQUATE

## 2020-05-19 ENCOUNTER — Telehealth: Payer: Self-pay | Admitting: Family Medicine

## 2020-05-19 NOTE — Telephone Encounter (Signed)
Urine culture was normal

## 2020-05-20 NOTE — Telephone Encounter (Signed)
Tried to call patient to let her know this, no answer and VM was "full"

## 2020-07-12 ENCOUNTER — Telehealth: Payer: Self-pay | Admitting: Family Medicine

## 2020-07-12 DIAGNOSIS — M545 Low back pain, unspecified: Secondary | ICD-10-CM

## 2020-07-12 DIAGNOSIS — G8929 Other chronic pain: Secondary | ICD-10-CM

## 2020-07-12 NOTE — Telephone Encounter (Signed)
Please advise 

## 2020-07-12 NOTE — Telephone Encounter (Signed)
Will request ESI.  I can attempt to scoop out the ear wax, but we don't have the ear wax irrigation equipment, unfortunately.  If she needs irrigation, we'll have to refer to ENT.

## 2020-07-12 NOTE — Telephone Encounter (Signed)
Pt called wanting to know if Dr.Hilts will remove wax from her ear? She also wanted to let Dr.Hilts know she's interested in the deep back injections.  [phone]

## 2020-07-13 NOTE — Telephone Encounter (Signed)
I called and lmom for pt advising of Dr. Junius Roads message

## 2020-07-21 ENCOUNTER — Encounter: Payer: Self-pay | Admitting: Family Medicine

## 2020-07-21 ENCOUNTER — Other Ambulatory Visit: Payer: Self-pay

## 2020-07-21 ENCOUNTER — Ambulatory Visit (INDEPENDENT_AMBULATORY_CARE_PROVIDER_SITE_OTHER): Payer: Medicare Other | Admitting: Family Medicine

## 2020-07-21 DIAGNOSIS — H6122 Impacted cerumen, left ear: Secondary | ICD-10-CM

## 2020-07-21 NOTE — Progress Notes (Signed)
Office Visit Note   Patient: LAQUANDA BICK           Date of Birth: Dec 23, 1940           MRN: [id number] Visit Date: 07/21/2020 Requested by: Eunice Blase, MD [address] [place],  [place] [address] PCP: Eunice Blase, MD  Subjective: Chief Complaint  Patient presents with  . check ears, intermittent hearing issues - no pain/pressure    HPI: She is here with decreased hearing. She has a history of earwax troubles in the past, she feels like her ears have been blocked up for the past week or 2. The left one seems to be the worst. No pain, no fevers.              ROS:  All other systems were reviewed and are negative.  Objective: Vital Signs: LMP  (LMP Unknown)   Physical Exam:  General:  Alert and oriented, in no acute distress. Pulm:  Breathing unlabored. Psy:  Normal mood, congruent affect.  Ears: Her right ear canal is clear and the tympanic membrane looks normal. The left one is occluded by hard cerumen. I attempted to extract it but was unsuccessful.   Imaging: No results found.  Assessment & Plan: 1. Left ear cerumen impaction -She will try using a mixture of water and peroxide, half-and-half. She will do this a couple times a day. If she still doesn't notice improvement, she will contact me for ENT referral.     Procedures: No procedures performed  No notes on file     PMFS History: Patient Active Problem List   Diagnosis Date Noted  . Age-related osteoporosis without current pathological fracture 05/17/2020  . PTSD (post-traumatic stress disorder) 05/17/2020  . Hyperlipidemia 05/17/2020  . Chronic neck pain 05/17/2020  . Chronic low back pain 05/17/2020  . Closed fracture of lateral portion of right tibial plateau 02/27/2019  . Tibial plateau fracture, right, closed, initial encounter 02/23/2019   Past Medical History:  Diagnosis Date  . 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 Cataracts, bilateral    surgery to remove   . Chronic back pain   .  Depression   . Dysrhythmia    occasional  . History of cardiac aneurysm    from PCP in [place]  . Insomnia   . Osteoporosis    no med  . Pulmonary HTN ([place])    Hx from PCP in [place]    History reviewed. No pertinent family history.  Past Surgical History:  Procedure Laterality Date  . DILATION AND CURETTAGE OF UTERUS     x 3  . EYE SURGERY     cataract eye surgery - Bilateral  . ORIF TIBIA PLATEAU Right 02/27/2019   Procedure: OPEN REDUCTION INTERNAL FIXATION (ORIF) RIGHT TIBIAL PLATEAU FRACTURE;  Surgeon: Mcarthur Rossetti, MD;  Location: [facility];  Service: Orthopedics;  Laterality: Right;  . TONSILLECTOMY     Social History   Occupational History  . Not on file  Tobacco Use  . Smoking status: Former Smoker    Packs/day: 0.50    Years: 57.00    Pack years: 28.50    Types: Cigarettes    Quit date: 02/11/2015    Years since quitting: 5.4  . Smokeless tobacco: Never Used  Vaping Use  . Vaping Use: Never used  Substance and Sexual Activity  . Alcohol use: Not Currently  . Drug use: Never  . Sexual activity: Not on file

## 2020-07-28 ENCOUNTER — Other Ambulatory Visit: Payer: Self-pay

## 2020-07-28 ENCOUNTER — Ambulatory Visit: Payer: Self-pay

## 2020-07-28 ENCOUNTER — Ambulatory Visit (INDEPENDENT_AMBULATORY_CARE_PROVIDER_SITE_OTHER): Payer: Medicare Other | Admitting: Physical Medicine and Rehabilitation

## 2020-07-28 ENCOUNTER — Encounter: Payer: Self-pay | Admitting: Physical Medicine and Rehabilitation

## 2020-07-28 VITALS — BP 134/76 | HR 65

## 2020-07-28 DIAGNOSIS — M5416 Radiculopathy, lumbar region: Secondary | ICD-10-CM

## 2020-07-28 DIAGNOSIS — M48062 Spinal stenosis, lumbar region with neurogenic claudication: Secondary | ICD-10-CM

## 2020-07-28 MED ORDER — METHYLPREDNISOLONE ACETATE 80 MG/ML IJ SUSP
80.0000 mg | Freq: Once | INTRAMUSCULAR | Status: AC
  Administered 2020-07-28: 80 mg

## 2020-07-28 NOTE — Progress Notes (Signed)
   Pt state lower back pain. Pt state sitting in a car having to makes turn bring stress and pain to her back. Pt state nothing really helps from the pain, pt state pain meds and sitting helps.  Numeric Pain Rating Scale and Functional Assessment Average Pain 2   In the last MONTH (on 0-10 scale) has pain interfered with the following?  1. General activity like being  able to carry out your everyday physical activities such as walking, climbing stairs, carrying groceries, or moving a chair?  Rating(10)   +Driver, -BT, -Dye Allergies.

## 2020-08-02 ENCOUNTER — Telehealth: Payer: Self-pay

## 2020-08-02 NOTE — Telephone Encounter (Signed)
[place] from [facility] called she stated patient had an reaction to medication. Mediation is rosuvastatin she stated she had no major reactions she's more hyperactive.call back:[phone]

## 2020-08-02 NOTE — Telephone Encounter (Signed)
I called the patient to find out what type of reaction, specifically, she had to rosuvastatin - left a voice mail for her to call me back.

## 2020-08-04 ENCOUNTER — Other Ambulatory Visit: Payer: Self-pay

## 2020-08-04 ENCOUNTER — Telehealth: Payer: Self-pay | Admitting: *Deleted

## 2020-08-04 DIAGNOSIS — H6123 Impacted cerumen, bilateral: Secondary | ICD-10-CM

## 2020-08-04 MED ORDER — PRAVASTATIN SODIUM 10 MG PO TABS: 10.0000 mg | ORAL_TABLET | Freq: Every day | ORAL | 6 refills | Status: DC

## 2020-08-04 MED ORDER — CLONAZEPAM 0.5 MG PO TABS: 0.2500 mg | ORAL_TABLET | Freq: Every evening | ORAL | 1 refills | Status: DC | PRN

## 2020-08-04 NOTE — Telephone Encounter (Signed)
Submitted referral thru [facility] to Dr. Benjamine Mola Office, pending appt

## 2020-08-04 NOTE — Telephone Encounter (Signed)
-----   Message from Marlyne Beards, [place] sent at 08/04/2020 12:06 PM EDT ----- Regarding: referral to ENT We put in a referral today for this patient to see Dr. Benjamine Mola for bilateral cerumen impaction. We had no way here to irrigate her ears and she had no success with it at home. The patient wishes to call that office herself (I gave her the number). Can you go ahead and just send the referral over to them so they will have it when she calls?

## 2020-08-04 NOTE — Addendum Note (Signed)
Addended by: Hortencia Pilar on: 08/04/2020 11:31 AM   Modules accepted: Orders

## 2020-08-04 NOTE — Telephone Encounter (Signed)
I called the patient and advised her of the change from rosuvastatin to pravachol, and the new qty/direction on clonazepam - both sent in to her pharmacy. The patient was given the number to Dr. Deeann Saint office, as she wished to call there, herself. Referral to that office has been placed.

## 2020-08-04 NOTE — Telephone Encounter (Signed)
Pravachol & klonopin Rx's sent.  Would suggest Dr. Benjamine Mola for ENT.  Can make referral if she'd like.

## 2020-08-04 NOTE — Telephone Encounter (Signed)
I called the patient: 1) she said the rosuvastatin did make her a bit hyper and she would like to try an alternative. 2) her ears are still clogged, despite using the peroxide - would like to know the name of an ENT & 3) usually takes  1/2 a clonazepam 0.5 mg, as prescribed; however, occasionally her pain is worse and she takes a whole one - she is asking for a new Rx that says 1 hs prn, so that she does not have a problem when she needs to get it filled again (not currently needing a refill). Please advise on all 3.

## 2020-08-05 NOTE — Progress Notes (Signed)
Michelle Vaughan - 79 y.o. female MRN [id number]  Date of birth: 03-26-1941  Office Visit Note: Visit Date: 07/28/2020 PCP: Eunice Blase, MD Referred by: Eunice Blase, MD  Subjective: Chief Complaint  Patient presents with  . Lower Back - Pain   HPI:  Michelle Vaughan is a 79 y.o. female who comes in today at the request of Dr. Eunice Blase for planned Right L5-S1 Lumbar epidural steroid injection with fluoroscopic guidance.  The patient has failed conservative care including home exercise, medications, time and activity modification.  This injection will be diagnostic and hopefully therapeutic.  Please see requesting physician notes for further details and justification.  MRI reviewed with images and spine model.  MRI reviewed in the note below.  Patient with right more than left low back and hip and thigh pain.  She has had prior MRI in 2019 showing multifactorial stenosis at L3-4 L4-5 moderate severe.  There was also a facet joint cyst on the right medially that could impact the L5-S1 nerve root.  We will complete today the epidural injection that would consider L5-S1 facet joint aspiration and injection.   ROS Otherwise per HPI.  Assessment & Plan: Visit Diagnoses:  1. Lumbar radiculopathy   2. Spinal stenosis of lumbar region with neurogenic claudication     Plan: No additional findings.   Meds & Orders:  Meds ordered this encounter  Medications  . methylPREDNISolone acetate (DEPO-MEDROL) injection 80 mg    Orders Placed This Encounter  Procedures  . XR C-ARM NO REPORT  . Epidural Steroid injection    Follow-up: Return if symptoms worsen or fail to improve.   Procedures: No procedures performed  Lumbar Epidural Steroid Injection - Interlaminar Approach with Fluoroscopic Guidance  Patient: Michelle Vaughan      Date of Birth: 05/21/1941 MRN: [id number] PCP: Eunice Blase, MD      Visit Date: 07/28/2020   Universal Protocol:     Consent Given By: the  patient  Position: PRONE  Additional Comments: Vital signs were monitored before and after the procedure. Patient was prepped and draped in the usual sterile fashion. The correct patient, procedure, and site was verified.   Injection Procedure Details:  Procedure Site One Meds Administered:  Meds ordered this encounter  Medications  . methylPREDNISolone acetate (DEPO-MEDROL) injection 80 mg     Laterality: Right  Location/Site:  L5-S1  Needle size: 20 G  Needle type: Tuohy  Needle Placement: Paramedian epidural  Findings:   -Comments: Excellent flow of contrast into the epidural space.  Procedure Details: Using a paramedian approach from the side mentioned above, the region overlying the inferior lamina was localized under fluoroscopic visualization and the soft tissues overlying this structure were infiltrated with 4 ml. of 1% Lidocaine without Epinephrine. The Tuohy needle was inserted into the epidural space using a paramedian approach.   The epidural space was localized using loss of resistance along with lateral and bi-planar fluoroscopic views.  After negative aspirate for air, blood, and CSF, a 2 ml. volume of Isovue-250 was injected into the epidural space and the flow of contrast was observed. Radiographs were obtained for documentation purposes.    The injectate was administered into the level noted above.   Additional Comments:  The patient tolerated the procedure well Dressing: 2 x 2 sterile gauze and Band-Aid    Post-procedure details: Patient was observed during the procedure. Post-procedure instructions were reviewed.  Patient left the clinic in stable condition.  Clinical History: February 06, 2018 lumbar spine MRI Degenerative retrolisthesis of L2 on L3 with normal anatomic alignment otherwise  L5-S1 right-sided facet joint cyst medially L4-5 moderate to severe lumbar multifactorial stenosis L3-4 moderate multifactorial stenosis No evidence  of prior lumbar surgery.  Lumbar spondylosis throughout. ----  Findings: AP, lateral, and cone-down lateral views of the lumbar spine.   Rightward thoracolumbar spinal curvature. Disc space narrowing and  osteophytes from L1-2 through L3-4. Mild osteophyte formation at L4-5.  Retrolisthesis at L2-3 measures approximate 4 mm. Retrolisthesis at L3-4  measures approximately 3 mm. Patient is mildly rotated which slightly  limits evaluation. Bone mineralization may be decreased. No fracture is  identified.   Prominent atherosclerotic calcification of the abdominal aorta and iliac  arteries.  Specimen Collected: -  Date: 08/13/16     Objective:  VS:  HT:    WT:   BMI:     BP:134/76  HR:65bpm  TEMP: ( )  RESP:  Physical Exam Constitutional:      General: She is not in acute distress.    Appearance: Normal appearance. She is not ill-appearing.  HENT:     Head: Normocephalic and atraumatic.     Right Ear: External ear normal.     Left Ear: External ear normal.  Eyes:     Extraocular Movements: Extraocular movements intact.  Cardiovascular:     Rate and Rhythm: Normal rate.     Pulses: Normal pulses.  Musculoskeletal:     Right lower leg: No edema.     Left lower leg: No edema.     Comments: Patient has good distal strength with no pain over the greater trochanters.  No clonus or focal weakness.  Skin:    Findings: No erythema, lesion or rash.  Neurological:     General: No focal deficit present.     Mental Status: She is alert and oriented to person, place, and time.     Sensory: No sensory deficit.     Motor: No weakness or abnormal muscle tone.     Coordination: Coordination normal.  Psychiatric:        Mood and Affect: Mood normal.        Behavior: Behavior normal.      Imaging: No results found.

## 2020-08-05 NOTE — Procedures (Signed)
Lumbar Epidural Steroid Injection - Interlaminar Approach with Fluoroscopic Guidance  Patient: Michelle Vaughan      Date of Birth: 11-Jun-1941 MRN: [id number] PCP: Eunice Blase, MD      Visit Date: 07/28/2020   Universal Protocol:     Consent Given By: the patient  Position: PRONE  Additional Comments: Vital signs were monitored before and after the procedure. Patient was prepped and draped in the usual sterile fashion. The correct patient, procedure, and site was verified.   Injection Procedure Details:  Procedure Site One Meds Administered:  Meds ordered this encounter  Medications  . methylPREDNISolone acetate (DEPO-MEDROL) injection 80 mg     Laterality: Right  Location/Site:  L5-S1  Needle size: 20 G  Needle type: Tuohy  Needle Placement: Paramedian epidural  Findings:   -Comments: Excellent flow of contrast into the epidural space.  Procedure Details: Using a paramedian approach from the side mentioned above, the region overlying the inferior lamina was localized under fluoroscopic visualization and the soft tissues overlying this structure were infiltrated with 4 ml. of 1% Lidocaine without Epinephrine. The Tuohy needle was inserted into the epidural space using a paramedian approach.   The epidural space was localized using loss of resistance along with lateral and bi-planar fluoroscopic views.  After negative aspirate for air, blood, and CSF, a 2 ml. volume of Isovue-250 was injected into the epidural space and the flow of contrast was observed. Radiographs were obtained for documentation purposes.    The injectate was administered into the level noted above.   Additional Comments:  The patient tolerated the procedure well Dressing: 2 x 2 sterile gauze and Band-Aid    Post-procedure details: Patient was observed during the procedure. Post-procedure instructions were reviewed.  Patient left the clinic in stable condition.

## 2020-08-09 NOTE — Telephone Encounter (Signed)
Pt is scheduled 08/30/20  at 1030am with Dr. Benjamine Mola.

## 2020-08-25 DIAGNOSIS — Z23 Encounter for immunization: Secondary | ICD-10-CM | POA: Diagnosis not present

## 2020-08-30 DIAGNOSIS — H6122 Impacted cerumen, left ear: Secondary | ICD-10-CM | POA: Diagnosis not present

## 2020-08-30 DIAGNOSIS — H9012 Conductive hearing loss, unilateral, left ear, with unrestricted hearing on the contralateral side: Secondary | ICD-10-CM | POA: Diagnosis not present

## 2020-09-27 DIAGNOSIS — H6122 Impacted cerumen, left ear: Secondary | ICD-10-CM | POA: Diagnosis not present

## 2020-10-18 ENCOUNTER — Ambulatory Visit: Payer: Medicare Other | Admitting: Family Medicine

## 2020-10-19 ENCOUNTER — Ambulatory Visit: Payer: Medicare Other | Admitting: Family Medicine

## 2020-10-21 ENCOUNTER — Encounter: Payer: Self-pay | Admitting: Family Medicine

## 2020-10-21 ENCOUNTER — Other Ambulatory Visit: Payer: Self-pay

## 2020-10-21 ENCOUNTER — Ambulatory Visit (INDEPENDENT_AMBULATORY_CARE_PROVIDER_SITE_OTHER): Payer: Medicare Other | Admitting: Family Medicine

## 2020-10-21 DIAGNOSIS — M25512 Pain in left shoulder: Secondary | ICD-10-CM | POA: Diagnosis not present

## 2020-10-21 NOTE — Patient Instructions (Signed)
   [facility] [employer]    Can also use a tennis ball inside of a sock, and hold end of sock with right hand while lowering it over your left shoulder (put your left hand across your chest to your right shoulder while doing it).

## 2020-10-21 NOTE — Progress Notes (Signed)
Office Visit Note   Patient: Michelle Vaughan           Date of Birth: [birth year]           MRN: [id number] Visit Date: 10/21/2020 Requested by: Eunice Blase, MD [address] [place],  [place] [address] PCP: Eunice Blase, MD  Subjective: Chief Complaint  Patient presents with  . Left Shoulder - Pain    Pain around the left scapula. Wakes her from sleep x a couple weeks.    HPI: She is here with left shoulder pain.  She has a chronic history of neck problems, but occasionally her left scapula area starts hurting her and it happened a few weeks ago.  The pain is constant, keeps her from sleeping at night.  No radicular pain.  In the past she has had dry needling which helps for a while.  She states that the pressure usually works better for her.                ROS:   All other systems were reviewed and are negative.  Objective: Vital Signs: LMP  (LMP Unknown)   Physical Exam:  General:  Alert and oriented, in no acute distress. Pulm:  Breathing unlabored. Psy:  Normal mood, congruent affect. Skin: No visible rash Left shoulder: Full active range of motion.  She has a tender trigger point in the rhomboid area that seems to reproduce her pain.  After deep palpation of this, she had improvement in her symptoms.    Imaging: No results found.  Assessment & Plan: 1.  Myofascial left upper back/posterior shoulder pain -We discussed home treatment using a tennis ball inside of his sock.  Another option would be to purchase a Thera-Cane. -If symptoms persist, could consider injection.     Procedures: No procedures performed        PMFS History: Patient Active Problem List   Diagnosis Date Noted  . Age-related osteoporosis without current pathological fracture 05/17/2020  . PTSD (post-traumatic stress disorder) 05/17/2020  . Hyperlipidemia 05/17/2020  . Chronic neck pain 05/17/2020  . Chronic low back pain 05/17/2020  . Closed fracture of lateral portion of right  tibial plateau 02/27/2019  . Tibial plateau fracture, right, closed, initial encounter 02/23/2019   Past Medical History:  Diagnosis Date  . 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 Cataracts, bilateral    surgery to remove   . Chronic back pain   . Depression   . Dysrhythmia    occasional  . History of cardiac aneurysm    from PCP in [place]  . Insomnia   . Osteoporosis    no med  . Pulmonary HTN ([place] [facility])    Hx from PCP in [place]    No family history on file.  Past Surgical History:  Procedure Laterality Date  . DILATION AND CURETTAGE OF UTERUS     x 3  . EYE SURGERY     cataract eye surgery - Bilateral  . ORIF TIBIA PLATEAU Right 02/27/2019   Procedure: OPEN REDUCTION INTERNAL FIXATION (ORIF) RIGHT TIBIAL PLATEAU FRACTURE;  Surgeon: Mcarthur Rossetti, MD;  Location: [facility];  Service: Orthopedics;  Laterality: Right;  . TONSILLECTOMY     Social History   Occupational History  . Not on file  Tobacco Use  . Smoking status: Former Smoker    Packs/day: 0.50    Years: 57.00    Pack years: 28.50    Types: Cigarettes    Quit  date: 02/11/2015    Years since quitting: 5.6  . Smokeless tobacco: Never Used  Vaping Use  . Vaping Use: Never used  Substance and Sexual Activity  . Alcohol use: Not Currently  . Drug use: Never  . Sexual activity: Not on file

## 2020-11-01 ENCOUNTER — Ambulatory Visit: Payer: Medicare Other | Admitting: Family Medicine

## 2020-11-08 ENCOUNTER — Other Ambulatory Visit: Payer: Self-pay | Admitting: Family Medicine

## 2020-11-22 ENCOUNTER — Telehealth: Payer: Self-pay | Admitting: Family Medicine

## 2020-11-22 NOTE — Telephone Encounter (Signed)
Pt wants blood work done and she wants it before her appt on the 28th. Please call her [phone]

## 2020-11-22 NOTE — Telephone Encounter (Signed)
She is having her physical on 12/09/20.

## 2020-11-23 ENCOUNTER — Other Ambulatory Visit: Payer: Self-pay | Admitting: Family Medicine

## 2020-11-23 DIAGNOSIS — M810 Age-related osteoporosis without current pathological fracture: Secondary | ICD-10-CM

## 2020-11-23 DIAGNOSIS — E785 Hyperlipidemia, unspecified: Secondary | ICD-10-CM

## 2020-11-23 DIAGNOSIS — Z0000 Encounter for general adult medical examination without abnormal findings: Secondary | ICD-10-CM

## 2020-11-23 NOTE — Telephone Encounter (Signed)
I called the patient - will come in at 4:00 on 12/06/20 for the lab work (she will fast 6-8 hours beforehand).

## 2020-11-23 NOTE — Telephone Encounter (Signed)
Labs ordered.

## 2020-12-06 ENCOUNTER — Ambulatory Visit (INDEPENDENT_AMBULATORY_CARE_PROVIDER_SITE_OTHER): Payer: Medicare Other

## 2020-12-06 ENCOUNTER — Other Ambulatory Visit: Payer: Self-pay

## 2020-12-06 DIAGNOSIS — G8929 Other chronic pain: Secondary | ICD-10-CM

## 2020-12-06 DIAGNOSIS — E785 Hyperlipidemia, unspecified: Secondary | ICD-10-CM

## 2020-12-06 DIAGNOSIS — Z0000 Encounter for general adult medical examination without abnormal findings: Secondary | ICD-10-CM | POA: Diagnosis not present

## 2020-12-06 DIAGNOSIS — M810 Age-related osteoporosis without current pathological fracture: Secondary | ICD-10-CM

## 2020-12-06 DIAGNOSIS — M542 Cervicalgia: Secondary | ICD-10-CM

## 2020-12-06 DIAGNOSIS — M545 Low back pain, unspecified: Secondary | ICD-10-CM

## 2020-12-06 NOTE — Progress Notes (Signed)
Fasting labs drawn (at 4:08 pm) for CPE next week, per Dr. Junius Roads.

## 2020-12-07 ENCOUNTER — Telehealth: Payer: Self-pay | Admitting: Family Medicine

## 2020-12-07 LAB — CBC WITH DIFFERENTIAL/PLATELET
Absolute Monocytes: 525 cells/uL (ref 200–950)
Basophils Absolute: 90 cells/uL (ref 0–200)
Basophils Relative: 1.2 %
Eosinophils Absolute: 180 cells/uL (ref 15–500)
Eosinophils Relative: 2.4 %
HCT: 41.1 % (ref 35.0–45.0)
Hemoglobin: 14 g/dL (ref 11.7–15.5)
Lymphs Abs: 2400 cells/uL (ref 850–3900)
MCH: 32.2 pg (ref 27.0–33.0)
MCHC: 34.1 g/dL (ref 32.0–36.0)
MCV: 94.5 fL (ref 80.0–100.0)
MPV: 10.3 fL (ref 7.5–12.5)
Monocytes Relative: 7 %
Neutro Abs: 4305 cells/uL (ref 1500–7800)
Neutrophils Relative %: 57.4 %
Platelets: 248 10*3/uL (ref 140–400)
RBC: 4.35 10*6/uL (ref 3.80–5.10)
RDW: 11.9 % (ref 11.0–15.0)
Total Lymphocyte: 32 %
WBC: 7.5 10*3/uL (ref 3.8–10.8)

## 2020-12-07 LAB — THYROID PANEL WITH TSH
Free Thyroxine Index: 3 (ref 1.4–3.8)
T3 Uptake: 29 % (ref 22–35)
T4, Total: 10.4 ug/dL (ref 5.1–11.9)
TSH: 0.95 mIU/L (ref 0.40–4.50)

## 2020-12-07 LAB — COMPREHENSIVE METABOLIC PANEL
AG Ratio: 1.4 (calc) (ref 1.0–2.5)
ALT: 10 U/L (ref 6–29)
AST: 17 U/L (ref 10–35)
Albumin: 4.2 g/dL (ref 3.6–5.1)
Alkaline phosphatase (APISO): 90 U/L (ref 37–153)
BUN: 18 mg/dL (ref 7–25)
CO2: 28 mmol/L (ref 20–32)
Calcium: 10.2 mg/dL (ref 8.6–10.4)
Chloride: 102 mmol/L (ref 98–110)
Creat: 0.68 mg/dL (ref 0.60–0.93)
Globulin: 3 g/dL (calc) (ref 1.9–3.7)
Glucose, Bld: 88 mg/dL (ref 65–99)
Potassium: 4.1 mmol/L (ref 3.5–5.3)
Sodium: 140 mmol/L (ref 135–146)
Total Bilirubin: 0.3 mg/dL (ref 0.2–1.2)
Total Protein: 7.2 g/dL (ref 6.1–8.1)

## 2020-12-07 LAB — LIPID PANEL
Cholesterol: 235 mg/dL — ABNORMAL HIGH (ref <200)
HDL: 64 mg/dL (ref >=50)
LDL Cholesterol (Calc): 151 mg/dL (calc) — ABNORMAL HIGH
Non-HDL Cholesterol (Calc): 171 mg/dL (calc) — ABNORMAL HIGH (ref <130)
Total CHOL/HDL Ratio: 3.7 (calc) (ref <5.0)
Triglycerides: 92 mg/dL (ref <150)

## 2020-12-07 LAB — VITAMIN D 25 HYDROXY (VIT D DEFICIENCY, FRACTURES): Vit D, 25-Hydroxy: 36 ng/mL (ref 30–100)

## 2020-12-07 LAB — HIGH SENSITIVITY CRP: hs-CRP: 1.6 mg/L

## 2020-12-07 NOTE — Telephone Encounter (Signed)
Labs show:  CBC looks great.  Metabolic panel also normal.  CRP and thyroid studies look perfect.  Vitamin D is normal, but ideal is 50-80.  I recommend taking an additional vitamin D3 at 2,000 IU daily.  Lipids are slightly elevated.  It's important to exercise regularly, and to minimize intake of breads; pastas; cereals; sugars/sweets.  Recheck in 6-12 months.

## 2020-12-09 ENCOUNTER — Encounter: Payer: Self-pay | Admitting: Family Medicine

## 2020-12-09 ENCOUNTER — Other Ambulatory Visit: Payer: Self-pay

## 2020-12-09 ENCOUNTER — Ambulatory Visit (INDEPENDENT_AMBULATORY_CARE_PROVIDER_SITE_OTHER): Payer: Medicare Other | Admitting: Family Medicine

## 2020-12-09 VITALS — BP 137/82 | HR 101 | Ht 62.0 in | Wt 121.0 lb

## 2020-12-09 DIAGNOSIS — F4310 Post-traumatic stress disorder, unspecified: Secondary | ICD-10-CM | POA: Diagnosis not present

## 2020-12-09 DIAGNOSIS — Z0000 Encounter for general adult medical examination without abnormal findings: Secondary | ICD-10-CM

## 2020-12-09 DIAGNOSIS — M810 Age-related osteoporosis without current pathological fracture: Secondary | ICD-10-CM | POA: Diagnosis not present

## 2020-12-09 DIAGNOSIS — N8110 Cystocele, unspecified: Secondary | ICD-10-CM | POA: Diagnosis not present

## 2020-12-09 DIAGNOSIS — I2541 Coronary artery aneurysm: Secondary | ICD-10-CM

## 2020-12-09 DIAGNOSIS — E785 Hyperlipidemia, unspecified: Secondary | ICD-10-CM | POA: Diagnosis not present

## 2020-12-09 MED ORDER — CLONAZEPAM 0.5 MG PO TABS: ORAL_TABLET | ORAL | 1 refills | Status: DC

## 2020-12-09 MED ORDER — PRAVASTATIN SODIUM 10 MG PO TABS: 5.0000 mg | ORAL_TABLET | Freq: Every day | ORAL | 3 refills | Status: AC

## 2020-12-09 MED ORDER — ESCITALOPRAM OXALATE 20 MG PO TABS: 20.0000 mg | ORAL_TABLET | Freq: Every day | ORAL | 1 refills | Status: DC

## 2020-12-09 NOTE — Progress Notes (Signed)
Office Visit Note   Patient: Michelle Vaughan           Date of Birth: 05-Nov-1941           MRN: [id number] Visit Date: 12/09/2020 Requested by: Eunice Blase, MD [address] [place],  [place] [address] PCP: Eunice Blase, MD  Subjective: Chief Complaint  Patient presents with  . Medicare Wellness    HPI: She is here for wellness examination.  She had labs done recently which looked good except for elevated total and LDL cholesterol numbers.  She has not been taking her pravastatin.  She has trouble eating healthfully due to busy family schedule.  She recently went to ENT for cerumen impaction.  They had a difficult time getting rid of it.  She kept telling them that there was something in her right ear still, but they told her it looked clear.  The next morning she was able to remove a ball of wax with a Q-tip.  She was not happy with the experience.  Her chronic back pain is present but currently manageable.  She has done well in the past with epidural and facet injections, but she is not ready for that yet.  She has a history of cardiac artery aneurysm.  In the past she was monitored every 6 months with echocardiogram.  She has not had 1 in a little while and would like referral.  Depression symptoms are stable on medication.  Family history was updated.  She does not get colonoscopies, mammograms, or bone density test.                ROS:   All other systems were reviewed and are negative.  Objective: Vital Signs: BP 137/82   Pulse (!) 101   Ht 5\' 2"  (1.575 m)   Wt 121 lb (54.9 kg)   LMP  (LMP Unknown)   BMI 22.13 kg/m   Physical Exam:  General:  Alert and oriented, in no acute distress. Pulm:  Breathing unlabored. Psy:  Normal mood, congruent affect. Skin: No suspicious lesions. HEENT:  [place]/AT, PERRLA, EOM Full, no nystagmus.  Funduscopic examination within normal limits.  No conjunctival erythema.  Tympanic membranes are pearly gray with normal landmarks.   External ear canals are normal.  Nasal passages are clear.  Oropharynx is clear.  No significant lymphadenopathy.  No thyromegaly or nodules.  2+ carotid pulses without bruits. CV: Regular rate and rhythm without murmurs, rubs, or gallops.  No peripheral edema.  2+ radial and posterior tibial pulses. Lungs: Clear to auscultation throughout with no wheezing or areas of consolidation. Extremities: 2+ upper and lower DTRs.     Imaging: No results found.  Assessment & Plan: 1.  Wellness examination -Recent labs look good overall.  I will see her back in 6 to 12 months for routine visit.  2.  Chronic back pain -She will contact me when she wants referral to Dr. Ernestina Patches.  3.  History of coronary artery aneurysm -Referral for cardiology consult.  4.  Hyperlipidemia -She will try to do better with diet.  She will also start a statin at 1/2 tablet daily.  5.  History of PTSD -Refilled medications.     Procedures: No procedures performed        PMFS History: Patient Active Problem List   Diagnosis Date Noted  . Age-related osteoporosis without current pathological fracture 05/17/2020  . PTSD (post-traumatic stress disorder) 05/17/2020  . Hyperlipidemia 05/17/2020  . Chronic neck pain 05/17/2020  .  Chronic low back pain 05/17/2020  . Closed fracture of lateral portion of right tibial plateau 02/27/2019  . Tibial plateau fracture, right, closed, initial encounter 02/23/2019   Past Medical History:  Diagnosis Date  . 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 Cataracts, bilateral    surgery to remove   . Chronic back pain   . Depression   . Dysrhythmia    occasional  . History of cardiac aneurysm    from PCP in [place]  . Insomnia   . Osteoporosis    no med  . Pulmonary HTN ([facility])    Hx from PCP in [place]    Family History  Problem Relation Age of Onset  . Healthy Mother   . Heart disease Father   . Depression Brother        Lost a daughter  . Deep vein thrombosis Maternal  Grandmother        After fracture  . Cancer Neg Hx   . Diabetes Neg Hx     Past Surgical History:  Procedure Laterality Date  . DILATION AND CURETTAGE OF UTERUS     x 3  . EYE SURGERY     cataract eye surgery - Bilateral  . ORIF TIBIA PLATEAU Right 02/27/2019   Procedure: OPEN REDUCTION INTERNAL FIXATION (ORIF) RIGHT TIBIAL PLATEAU FRACTURE;  Surgeon: Mcarthur Rossetti, MD;  Location: [facility];  Service: Orthopedics;  Laterality: Right;  . TONSILLECTOMY     Social History   Occupational History  . Not on file  Tobacco Use  . Smoking status: Former Smoker    Packs/day: 0.50    Years: 57.00    Pack years: 28.50    Types: Cigarettes    Quit date: 02/11/2015    Years since quitting: 5.8  . Smokeless tobacco: Never Used  Vaping Use  . Vaping Use: Never used  Substance and Sexual Activity  . Alcohol use: Not Currently  . Drug use: Never  . Sexual activity: Not on file

## 2020-12-15 ENCOUNTER — Telehealth: Payer: Self-pay | Admitting: Family Medicine

## 2020-12-15 DIAGNOSIS — I2541 Coronary artery aneurysm: Secondary | ICD-10-CM

## 2020-12-15 NOTE — Telephone Encounter (Signed)
She was referred to Dr. Johnsie Cancel. Please advise.

## 2020-12-15 NOTE — Telephone Encounter (Signed)
Pt called stating she was referred to a cardiologist and since he won't be able to get her in until May, the pt would like to know if there's anyone else Dr. Junius Roads could refer her to for a scan? Pt would like a CB to be updated  [phone]

## 2020-12-16 NOTE — Telephone Encounter (Signed)
The cardiologists in the system are generally excellent.

## 2020-12-16 NOTE — Telephone Encounter (Signed)
FYI:  the patient would like to wait on the cardiologist appt and just have the echo done. If the echo shows she should see a cardiologist, she is willing to see one.

## 2020-12-16 NOTE — Telephone Encounter (Signed)
I called the patient to get some clarification on her request. She is asking if we could order the echocardiogram now, instead of waiting on an appointment with Dr. Johnsie Cancel (who cannot see her until May). I did ask her if she asked that office if another cardiologist could see her sooner, but the patient said she "wants the best." Is there another cardiologist, specifically, that she could be referred to, possibly after getting the echocardiogram?

## 2020-12-16 NOTE — Telephone Encounter (Signed)
The patient wants to know if we can order the echo or does she need to wait until she can get in with a cardiologist?

## 2020-12-16 NOTE — Telephone Encounter (Signed)
Orders placed for cardiology referral (first available).

## 2020-12-16 NOTE — Telephone Encounter (Signed)
It has been ordered.

## 2021-01-04 ENCOUNTER — Telehealth: Payer: Self-pay

## 2021-01-04 DIAGNOSIS — I340 Nonrheumatic mitral (valve) insufficiency: Secondary | ICD-10-CM

## 2021-01-04 NOTE — Telephone Encounter (Signed)
-----   Message from Josue Hector, MD sent at 01/04/2021  2:30 PM EST ----- New patient next week can try to get echo for MR before visit Wednesday
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
## 2021-01-04 NOTE — Telephone Encounter (Signed)
Left message for patient to call back. Already placed order for echo.

## 2021-01-05 NOTE — Telephone Encounter (Signed)
There are no openings for echo's available until 03/18 at our office and at [facility]. Patient wants to know if Dr. Johnsie Cancel can have any influence on getting her an echo prior to her appt on 03/01 because she does not want to have to move that appt.

## 2021-01-05 NOTE — Telephone Encounter (Signed)
We had a cancellation for 01/06/21 so I called patient and she will be here tomorrow at 3:30 for a 3:50 echo Appt.

## 2021-01-06 ENCOUNTER — Ambulatory Visit (HOSPITAL_COMMUNITY): Payer: Medicare Other | Attending: Internal Medicine

## 2021-01-06 ENCOUNTER — Other Ambulatory Visit: Payer: Self-pay

## 2021-01-06 DIAGNOSIS — I340 Nonrheumatic mitral (valve) insufficiency: Secondary | ICD-10-CM | POA: Diagnosis not present

## 2021-01-07 LAB — ECHOCARDIOGRAM COMPLETE
Area-P 1/2: 4.39 cm2
P 1/2 time: 617 msec
S' Lateral: 2.3 cm

## 2021-01-11 ENCOUNTER — Ambulatory Visit: Payer: Medicare Other | Admitting: Cardiovascular Disease

## 2021-01-11 ENCOUNTER — Other Ambulatory Visit: Payer: Self-pay

## 2021-01-11 ENCOUNTER — Encounter: Payer: Self-pay | Admitting: Cardiovascular Disease

## 2021-01-11 VITALS — BP 120/68 | HR 63 | Ht 62.0 in | Wt 123.0 lb

## 2021-01-11 DIAGNOSIS — I340 Nonrheumatic mitral (valve) insufficiency: Secondary | ICD-10-CM

## 2021-01-11 DIAGNOSIS — E782 Mixed hyperlipidemia: Secondary | ICD-10-CM

## 2021-01-11 NOTE — Patient Instructions (Addendum)
Medication Instructions:  *If you need a refill on your cardiac medications before your next appointment, please call your pharmacy*  Lab Work: Your physician recommends that you return for lab work in: May for fasting lipid and liver panel.  If you have labs (blood work) drawn today and your tests are completely normal, you will receive your results only by: Marland Kitchen MyChart Message (if you have MyChart) OR . A paper copy in the mail If you have any lab test that is abnormal or we need to change your treatment, we will call you to review the results.  Testing/Procedures: Your physician has requested that you have an echocardiogram in one year. Echocardiography is a painless test that uses sound waves to create images of your heart. It provides your doctor with information about the size and shape of your heart and how well your heart's chambers and valves are working. This procedure takes approximately one hour. There are no restrictions for this procedure.   Follow-Up: At [facility], you and your health needs are our priority.  As part of our continuing mission to provide you with exceptional heart care, we have created designated Provider Care Teams.  These Care Teams include your primary Cardiologist (physician) and Advanced Practice Providers (APPs -  Physician Assistants and Nurse Practitioners) who all work together to provide you with the care you need, when you need it.  We recommend signing up for the patient portal called "MyChart".  Sign up information is provided on this After Visit Summary.  MyChart is used to connect with patients for Virtual Visits (Telemedicine).  Patients are able to view lab/test results, encounter notes, upcoming appointments, etc.  Non-urgent messages can be sent to your provider as well.   To learn more about what you can do with MyChart, go to [email or web address].    Your next appointment:   12 month(s)  The format for your next appointment:   In  Person  Provider:   You may see Dr. Johnsie Cancel or one of the following Advanced Practice Providers on your designated Care Team:    Kathyrn Drown, NP

## 2021-01-30 ENCOUNTER — Other Ambulatory Visit (HOSPITAL_COMMUNITY): Payer: Medicare Other

## 2021-03-13 ENCOUNTER — Other Ambulatory Visit: Payer: Medicare Other

## 2021-03-30 ENCOUNTER — Telehealth: Payer: Self-pay | Admitting: Family Medicine

## 2021-03-30 MED ORDER — ESCITALOPRAM OXALATE 5 MG PO TABS: ORAL_TABLET | ORAL | 0 refills | Status: DC

## 2021-03-30 NOTE — Telephone Encounter (Signed)
The patient would like to stop taking escitalopram 20 mg daily. She would like to know how to taper off of it.

## 2021-03-30 NOTE — Telephone Encounter (Signed)
Patient called. She has some questions about her medication. Would like Terri to call her. [phone]

## 2021-03-30 NOTE — Telephone Encounter (Signed)
Rx sent for 5 mg tablets with tapering instructions.

## 2021-03-30 NOTE — Telephone Encounter (Signed)
I called and advised the patient. 

## 2021-06-06 ENCOUNTER — Telehealth: Payer: Self-pay | Admitting: Family Medicine

## 2021-06-06 MED ORDER — ESCITALOPRAM OXALATE 5 MG PO TABS: ORAL_TABLET | ORAL | 0 refills | Status: DC

## 2021-06-06 NOTE — Telephone Encounter (Signed)
Please advise 

## 2021-06-06 NOTE — Telephone Encounter (Signed)
Patient called asked if she can get 10 more tabs of the Escitalopram 5 mg? The number to contact patient is [phone]

## 2021-06-07 ENCOUNTER — Other Ambulatory Visit: Payer: Self-pay | Admitting: Family Medicine

## 2021-06-07 MED ORDER — ESCITALOPRAM OXALATE 5 MG PO TABS: ORAL_TABLET | ORAL | 0 refills | Status: DC

## 2021-06-12 ENCOUNTER — Other Ambulatory Visit: Payer: Self-pay | Admitting: Family Medicine

## 2021-06-16 ENCOUNTER — Telehealth: Payer: Self-pay | Admitting: Family Medicine

## 2021-06-16 ENCOUNTER — Other Ambulatory Visit: Payer: Self-pay

## 2021-06-16 MED ORDER — ESCITALOPRAM OXALATE 5 MG PO TABS: ORAL_TABLET | ORAL | 1 refills | Status: DC

## 2021-06-16 NOTE — Telephone Encounter (Signed)
Pt called and [facility] does not have medication lexapro so she was wondering if you could resend it.   CB [phone]

## 2021-06-16 NOTE — Addendum Note (Signed)
Addended by: Hortencia Pilar on: 06/16/2021 01:40 PM   Modules accepted: Orders

## 2021-06-19 ENCOUNTER — Other Ambulatory Visit: Payer: Self-pay | Admitting: Family Medicine

## 2021-07-05 ENCOUNTER — Other Ambulatory Visit: Payer: Self-pay

## 2021-07-05 ENCOUNTER — Ambulatory Visit (INDEPENDENT_AMBULATORY_CARE_PROVIDER_SITE_OTHER): Payer: Medicare Other | Admitting: Family Medicine

## 2021-07-05 ENCOUNTER — Encounter: Payer: Self-pay | Admitting: Family Medicine

## 2021-07-05 DIAGNOSIS — L723 Sebaceous cyst: Secondary | ICD-10-CM | POA: Diagnosis not present

## 2021-07-05 DIAGNOSIS — G8929 Other chronic pain: Secondary | ICD-10-CM | POA: Diagnosis not present

## 2021-07-05 DIAGNOSIS — M542 Cervicalgia: Secondary | ICD-10-CM | POA: Diagnosis not present

## 2021-07-05 DIAGNOSIS — M545 Low back pain, unspecified: Secondary | ICD-10-CM

## 2021-07-05 NOTE — Progress Notes (Signed)
Office Visit Note   Patient: Michelle Vaughan           Date of Birth: 1941-10-20           MRN: [id number] Visit Date: 07/05/2021 Requested by: Eunice Blase, MD [address] [place],  [place] [address] PCP: Eunice Blase, MD  Subjective: Chief Complaint  Patient presents with   Spine - Pain    Chronic pain. Requests PT   Other    Wound on posterior shoulder -- she thinks it might be a spider/insect bite - red around it with purulent drainage.    HPI: She is here with neck and low back pain.  Longstanding problems with these areas, it has flared up recently and she would like referral to physical therapy.  She also developed a skin lesion on her left shoulder area.  It was sore, not itchy.  She has not had fevers or chills.  She has been applying pressure to it and over-the-counter antiseptic.                ROS:   All other systems were reviewed and are negative.  Objective: Vital Signs: LMP  (LMP Unknown)   Physical Exam:  General:  Alert and oriented, in no acute distress. Pulm:  Breathing unlabored. Psy:  Normal mood, congruent affect. Skin: She has a sebaceous cyst in the left trapezius area.  The sebum was expressed.  There is no purulent drainage, no cellulitis. Neck: She has negative Spurling's test.  Upper extremity strength is normal.  Slightly tender in the left cervical paraspinous muscles. Low back: Tender in the left sciatic notch.  Lower extremity strength and reflexes are normal, straight leg raise negative.    Imaging: No results found.  Assessment & Plan: Chronic neck and low back pain -Referral to [facility] physical therapy.  2.  Left shoulder sebaceous cyst -Bandage as needed, watch for signs of infection.     Procedures: No procedures performed        PMFS History: Patient Active Problem List   Diagnosis Date Noted   Age-related osteoporosis without current pathological fracture 05/17/2020   PTSD (post-traumatic stress disorder)  05/17/2020   Hyperlipidemia 05/17/2020   Chronic neck pain 05/17/2020   Chronic low back pain 05/17/2020   Closed fracture of lateral portion of right tibial plateau 02/27/2019   Tibial plateau fracture, right, closed, initial encounter 02/23/2019   Past Medical History:  Diagnosis Date   Arthritis    hands - no meds   Cataracts, bilateral    surgery to remove    Chronic back pain    Depression    Dysrhythmia    occasional   History of cardiac aneurysm    from PCP in [place]   Insomnia    Osteoporosis    no med   Pulmonary HTN ([place])    Hx from PCP in [place]    Family History  Problem Relation Age of Onset   Healthy Mother    Heart disease Father    Depression Brother        Lost a daughter   Deep vein thrombosis Maternal Grandmother        After fracture   Cancer Neg Hx    Diabetes Neg Hx     Past Surgical History:  Procedure Laterality Date   DILATION AND CURETTAGE OF UTERUS     x 3   EYE SURGERY     cataract eye surgery - Bilateral   ORIF  TIBIA PLATEAU Right 02/27/2019   Procedure: OPEN REDUCTION INTERNAL FIXATION (ORIF) RIGHT TIBIAL PLATEAU FRACTURE;  Surgeon: Mcarthur Rossetti, MD;  Location: [place];  Service: Orthopedics;  Laterality: Right;   TONSILLECTOMY     Social History   Occupational History   Not on file  Tobacco Use   Smoking status: Former    Packs/day: 0.50    Years: 57.00    Pack years: 28.50    Types: Cigarettes    Quit date: 02/11/2015    Years since quitting: 6.4   Smokeless tobacco: Never  Vaping Use   Vaping Use: Never used  Substance and Sexual Activity   Alcohol use: Not Currently   Drug use: Never   Sexual activity: Not on file

## 2021-08-31 ENCOUNTER — Other Ambulatory Visit: Payer: Self-pay

## 2021-08-31 NOTE — Progress Notes (Signed)
Message received about refilling Klonopin from Dr Junius Roads but need clarification on directions please.

## 2021-09-01 MED ORDER — CLONAZEPAM 0.5 MG PO TABS: ORAL_TABLET | ORAL | 0 refills | Status: AC

## 2021-09-01 NOTE — Progress Notes (Signed)
0.5 mg tablet.  1/2 tablet nightly as needed anxiety.  Thanks #30 with no refills.

## 2021-09-01 NOTE — Addendum Note (Signed)
Addended byLaurann Montana on: 09/01/2021 02:11 PM   Modules accepted: Orders

## 2021-09-02 ENCOUNTER — Other Ambulatory Visit: Payer: Self-pay | Admitting: Orthopedic Surgery

## 2021-09-02 MED ORDER — CLONAZEPAM 0.5 MG PO TABS: ORAL_TABLET | ORAL | 0 refills | Status: AC

## 2021-09-02 NOTE — Progress Notes (Signed)
Sent per hilts request one time only

## 2021-09-12 DIAGNOSIS — M25512 Pain in left shoulder: Secondary | ICD-10-CM | POA: Diagnosis not present

## 2021-09-12 DIAGNOSIS — M25559 Pain in unspecified hip: Secondary | ICD-10-CM | POA: Diagnosis not present

## 2021-09-12 DIAGNOSIS — M545 Low back pain, unspecified: Secondary | ICD-10-CM | POA: Diagnosis not present

## 2021-09-15 DIAGNOSIS — M545 Low back pain, unspecified: Secondary | ICD-10-CM | POA: Diagnosis not present

## 2021-09-15 DIAGNOSIS — M25512 Pain in left shoulder: Secondary | ICD-10-CM | POA: Diagnosis not present

## 2021-09-15 DIAGNOSIS — M25559 Pain in unspecified hip: Secondary | ICD-10-CM | POA: Diagnosis not present

## 2021-09-19 DIAGNOSIS — M25512 Pain in left shoulder: Secondary | ICD-10-CM | POA: Diagnosis not present

## 2021-09-19 DIAGNOSIS — M25559 Pain in unspecified hip: Secondary | ICD-10-CM | POA: Diagnosis not present

## 2021-09-19 DIAGNOSIS — M545 Low back pain, unspecified: Secondary | ICD-10-CM | POA: Diagnosis not present

## 2021-09-21 DIAGNOSIS — M545 Low back pain, unspecified: Secondary | ICD-10-CM | POA: Diagnosis not present

## 2021-09-21 DIAGNOSIS — M25512 Pain in left shoulder: Secondary | ICD-10-CM | POA: Diagnosis not present

## 2021-09-21 DIAGNOSIS — M25559 Pain in unspecified hip: Secondary | ICD-10-CM | POA: Diagnosis not present

## 2021-09-26 DIAGNOSIS — M545 Low back pain, unspecified: Secondary | ICD-10-CM | POA: Diagnosis not present

## 2021-09-26 DIAGNOSIS — M25559 Pain in unspecified hip: Secondary | ICD-10-CM | POA: Diagnosis not present

## 2021-09-26 DIAGNOSIS — M25512 Pain in left shoulder: Secondary | ICD-10-CM | POA: Diagnosis not present

## 2021-09-29 DIAGNOSIS — M545 Low back pain, unspecified: Secondary | ICD-10-CM | POA: Diagnosis not present

## 2021-09-29 DIAGNOSIS — M25559 Pain in unspecified hip: Secondary | ICD-10-CM | POA: Diagnosis not present

## 2021-09-29 DIAGNOSIS — M25512 Pain in left shoulder: Secondary | ICD-10-CM | POA: Diagnosis not present

## 2021-10-03 DIAGNOSIS — M25512 Pain in left shoulder: Secondary | ICD-10-CM | POA: Diagnosis not present

## 2021-10-03 DIAGNOSIS — M25559 Pain in unspecified hip: Secondary | ICD-10-CM | POA: Diagnosis not present

## 2021-10-03 DIAGNOSIS — M545 Low back pain, unspecified: Secondary | ICD-10-CM | POA: Diagnosis not present

## 2021-10-09 DIAGNOSIS — M545 Low back pain, unspecified: Secondary | ICD-10-CM | POA: Diagnosis not present

## 2021-10-09 DIAGNOSIS — M25512 Pain in left shoulder: Secondary | ICD-10-CM | POA: Diagnosis not present

## 2021-10-09 DIAGNOSIS — M25559 Pain in unspecified hip: Secondary | ICD-10-CM | POA: Diagnosis not present

## 2021-10-11 DIAGNOSIS — M545 Low back pain, unspecified: Secondary | ICD-10-CM | POA: Diagnosis not present

## 2021-10-11 DIAGNOSIS — M25512 Pain in left shoulder: Secondary | ICD-10-CM | POA: Diagnosis not present

## 2021-10-11 DIAGNOSIS — M25559 Pain in unspecified hip: Secondary | ICD-10-CM | POA: Diagnosis not present

## 2021-10-16 DIAGNOSIS — M25512 Pain in left shoulder: Secondary | ICD-10-CM | POA: Diagnosis not present

## 2021-10-16 DIAGNOSIS — M545 Low back pain, unspecified: Secondary | ICD-10-CM | POA: Diagnosis not present

## 2021-10-16 DIAGNOSIS — M25559 Pain in unspecified hip: Secondary | ICD-10-CM | POA: Diagnosis not present

## 2021-10-18 DIAGNOSIS — M4802 Spinal stenosis, cervical region: Secondary | ICD-10-CM | POA: Diagnosis not present

## 2021-10-18 DIAGNOSIS — M48061 Spinal stenosis, lumbar region without neurogenic claudication: Secondary | ICD-10-CM | POA: Diagnosis not present

## 2021-10-18 DIAGNOSIS — M47812 Spondylosis without myelopathy or radiculopathy, cervical region: Secondary | ICD-10-CM | POA: Diagnosis not present

## 2021-10-18 DIAGNOSIS — M47816 Spondylosis without myelopathy or radiculopathy, lumbar region: Secondary | ICD-10-CM | POA: Diagnosis not present

## 2021-10-20 DIAGNOSIS — M25559 Pain in unspecified hip: Secondary | ICD-10-CM | POA: Diagnosis not present

## 2021-10-20 DIAGNOSIS — M25512 Pain in left shoulder: Secondary | ICD-10-CM | POA: Diagnosis not present

## 2021-10-20 DIAGNOSIS — M545 Low back pain, unspecified: Secondary | ICD-10-CM | POA: Diagnosis not present

## 2021-10-26 ENCOUNTER — Telehealth: Payer: Self-pay | Admitting: Family Medicine

## 2021-10-26 NOTE — Telephone Encounter (Signed)
Records requested from [facility]. I emailed records

## 2021-10-31 DIAGNOSIS — M25512 Pain in left shoulder: Secondary | ICD-10-CM | POA: Diagnosis not present

## 2021-10-31 DIAGNOSIS — M545 Low back pain, unspecified: Secondary | ICD-10-CM | POA: Diagnosis not present

## 2021-10-31 DIAGNOSIS — M25559 Pain in unspecified hip: Secondary | ICD-10-CM | POA: Diagnosis not present

## 2021-11-02 DIAGNOSIS — M545 Low back pain, unspecified: Secondary | ICD-10-CM | POA: Diagnosis not present

## 2021-11-02 DIAGNOSIS — M25512 Pain in left shoulder: Secondary | ICD-10-CM | POA: Diagnosis not present

## 2021-11-02 DIAGNOSIS — M25559 Pain in unspecified hip: Secondary | ICD-10-CM | POA: Diagnosis not present

## 2021-11-14 DIAGNOSIS — M25512 Pain in left shoulder: Secondary | ICD-10-CM | POA: Diagnosis not present

## 2021-11-14 DIAGNOSIS — M545 Low back pain, unspecified: Secondary | ICD-10-CM | POA: Diagnosis not present

## 2021-11-14 DIAGNOSIS — M25559 Pain in unspecified hip: Secondary | ICD-10-CM | POA: Diagnosis not present

## 2021-11-15 ENCOUNTER — Encounter: Payer: Self-pay | Admitting: Physical Medicine and Rehabilitation

## 2021-11-15 ENCOUNTER — Ambulatory Visit: Payer: Medicare Other | Admitting: Physical Medicine and Rehabilitation

## 2021-11-15 ENCOUNTER — Other Ambulatory Visit: Payer: Self-pay

## 2021-11-15 VITALS — BP 138/72 | HR 67

## 2021-11-15 DIAGNOSIS — M48062 Spinal stenosis, lumbar region with neurogenic claudication: Secondary | ICD-10-CM | POA: Diagnosis not present

## 2021-11-15 DIAGNOSIS — M47816 Spondylosis without myelopathy or radiculopathy, lumbar region: Secondary | ICD-10-CM | POA: Diagnosis not present

## 2021-11-15 DIAGNOSIS — M4726 Other spondylosis with radiculopathy, lumbar region: Secondary | ICD-10-CM | POA: Diagnosis not present

## 2021-11-15 DIAGNOSIS — M5416 Radiculopathy, lumbar region: Secondary | ICD-10-CM | POA: Diagnosis not present
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
## 2021-11-15 NOTE — Progress Notes (Signed)
Pt state lower back pain that travels to her buttocks and down right hip and both leg. Pt state any movement makes the pain worse. Pt state she take [place] meds to help ease her pain.  Numeric Pain Rating Scale and Functional Assessment Average Pain 9 Pain Right Now 7 My pain is constant, sharp, stabbing, tingling, and aching Pain is worse with: walking, bending, sitting, standing, some activites, and laying down Pain improves with: therapy/exercise and medication   In the last MONTH (on 0-10 scale) has pain interfered with the following?  1. General activity like being  able to carry out your everyday physical activities such as walking, climbing stairs, carrying groceries, or moving a chair?  Rating(7)  2. Relation with others like being able to carry out your usual social activities and roles such as  activities at home, at work and in your community. Rating(8)  3. Enjoyment of life such that you have  been bothered by emotional problems such as feeling anxious, depressed or irritable?  Rating(9)

## 2021-11-16 ENCOUNTER — Telehealth: Payer: Self-pay

## 2021-11-16 DIAGNOSIS — M545 Low back pain, unspecified: Secondary | ICD-10-CM | POA: Diagnosis not present

## 2021-11-16 DIAGNOSIS — M25559 Pain in unspecified hip: Secondary | ICD-10-CM | POA: Diagnosis not present

## 2021-11-16 DIAGNOSIS — M25512 Pain in left shoulder: Secondary | ICD-10-CM | POA: Diagnosis not present

## 2021-11-16 NOTE — Telephone Encounter (Signed)
Patient called and stating that she didn't make a return apt with Dr. Ernestina Patches yesterday. She would like a call back so she can get one scheduled.   Please advise

## 2021-11-17 ENCOUNTER — Telehealth: Payer: Self-pay | Admitting: Physical Medicine and Rehabilitation

## 2021-11-17 NOTE — Telephone Encounter (Signed)
Pt called stating she would like a CB to discuss the appt time Dr.Newton had on a Monday; she states it may work better than her current appt on 11/29/21.   [phone]

## 2021-11-21 DIAGNOSIS — M25559 Pain in unspecified hip: Secondary | ICD-10-CM | POA: Diagnosis not present

## 2021-11-21 DIAGNOSIS — M25512 Pain in left shoulder: Secondary | ICD-10-CM | POA: Diagnosis not present

## 2021-11-21 DIAGNOSIS — M545 Low back pain, unspecified: Secondary | ICD-10-CM | POA: Diagnosis not present

## 2021-11-23 ENCOUNTER — Ambulatory Visit (INDEPENDENT_AMBULATORY_CARE_PROVIDER_SITE_OTHER): Payer: Medicare Other | Admitting: Physical Medicine and Rehabilitation

## 2021-11-23 ENCOUNTER — Encounter: Payer: Self-pay | Admitting: Physical Medicine and Rehabilitation

## 2021-11-23 ENCOUNTER — Ambulatory Visit: Payer: Self-pay

## 2021-11-23 ENCOUNTER — Other Ambulatory Visit: Payer: Self-pay

## 2021-11-23 VITALS — BP 132/83 | HR 90

## 2021-11-23 DIAGNOSIS — M5416 Radiculopathy, lumbar region: Secondary | ICD-10-CM | POA: Diagnosis not present

## 2021-11-23 MED ORDER — METHYLPREDNISOLONE ACETATE 80 MG/ML IJ SUSP
80.0000 mg | Freq: Once | INTRAMUSCULAR | Status: AC
  Administered 2021-11-23: 80 mg

## 2021-11-23 NOTE — Patient Instructions (Signed)

## 2021-11-23 NOTE — Progress Notes (Signed)
Pt state lower back pain that travels to her buttocks and down right hip and both leg. Pt state any movement makes the pain worse. Pt state she take [place] meds to help ease her pain.  Numeric Pain Rating Scale and Functional Assessment Average Pain 5   In the last MONTH (on 0-10 scale) has pain interfered with the following?  1. General activity like being  able to carry out your everyday physical activities such as walking, climbing stairs, carrying groceries, or moving a chair?  Rating(10)   +Driver, -BT, -Dye Allergies.

## 2021-11-28 DIAGNOSIS — M25512 Pain in left shoulder: Secondary | ICD-10-CM | POA: Diagnosis not present

## 2021-11-28 DIAGNOSIS — M25559 Pain in unspecified hip: Secondary | ICD-10-CM | POA: Diagnosis not present

## 2021-11-28 DIAGNOSIS — M545 Low back pain, unspecified: Secondary | ICD-10-CM | POA: Diagnosis not present

## 2021-11-29 ENCOUNTER — Ambulatory Visit: Payer: Medicare Other | Admitting: Physical Medicine and Rehabilitation

## 2021-12-05 DIAGNOSIS — M25559 Pain in unspecified hip: Secondary | ICD-10-CM | POA: Diagnosis not present

## 2021-12-05 DIAGNOSIS — M25512 Pain in left shoulder: Secondary | ICD-10-CM | POA: Diagnosis not present

## 2021-12-05 DIAGNOSIS — M545 Low back pain, unspecified: Secondary | ICD-10-CM | POA: Diagnosis not present

## 2021-12-12 DIAGNOSIS — M25512 Pain in left shoulder: Secondary | ICD-10-CM | POA: Diagnosis not present

## 2021-12-12 DIAGNOSIS — M545 Low back pain, unspecified: Secondary | ICD-10-CM | POA: Diagnosis not present

## 2021-12-12 DIAGNOSIS — M25559 Pain in unspecified hip: Secondary | ICD-10-CM | POA: Diagnosis not present

## 2021-12-16 NOTE — Procedures (Signed)
Lumbar Epidural Steroid Injection - Interlaminar Approach with Fluoroscopic Guidance  Patient: Michelle Vaughan      Date of Birth: 1941/05/21 MRN: [id number] PCP: Eunice Blase, MD      Visit Date: 11/23/2021   Universal Protocol:     Consent Given By: the patient  Position: PRONE  Additional Comments: Vital signs were monitored before and after the procedure. Patient was prepped and draped in the usual sterile fashion. The correct patient, procedure, and site was verified.   Injection Procedure Details:   Procedure diagnoses: Lumbar radiculopathy [M54.16]   Meds Administered:  Meds ordered this encounter  Medications   methylPREDNISolone acetate (DEPO-MEDROL) injection 80 mg     Laterality: Right  Location/Site:  L4-5  Needle: 3.5 in., 20 ga. Tuohy  Needle Placement: Paramedian epidural  Findings:   -Comments: Excellent flow of contrast into the epidural space.  Procedure Details: Using a paramedian approach from the side mentioned above, the region overlying the inferior lamina was localized under fluoroscopic visualization and the soft tissues overlying this structure were infiltrated with 4 ml. of 1% Lidocaine without Epinephrine. The Tuohy needle was inserted into the epidural space using a paramedian approach.   The epidural space was localized using loss of resistance along with counter oblique bi-planar fluoroscopic views.  After negative aspirate for air, blood, and CSF, a 2 ml. volume of Isovue-250 was injected into the epidural space and the flow of contrast was observed. Radiographs were obtained for documentation purposes.    The injectate was administered into the level noted above.   Additional Comments:  No complications occurred Dressing: 2 x 2 sterile gauze and Band-Aid    Post-procedure details: Patient was observed during the procedure. Post-procedure instructions were reviewed.  Patient left the clinic in stable condition.

## 2021-12-16 NOTE — Progress Notes (Signed)
Michelle Vaughan - 81 y.o. female MRN [id number]  Date of birth: 1941/06/14  Office Visit Note: Visit Date: 11/23/2021 PCP: Eunice Blase, MD Referred by: Eunice Blase, MD  Subjective: Chief Complaint  Patient presents with   Lower Back - Pain   Right Hip - Pain   Left Leg - Pain   Right Leg - Pain   HPI:  Michelle Vaughan is a 81 y.o. female who comes in today at the request of Barnet Pall, FNP for planned Right L4-5 Lumbar Interlaminar epidural steroid injection with fluoroscopic guidance.  The patient has failed conservative care including home exercise, medications, time and activity modification.  This injection will be diagnostic and hopefully therapeutic.  Please see requesting physician notes for further details and justification.   ROS Otherwise per HPI.  Assessment & Plan: Visit Diagnoses:    ICD-10-CM   1. Lumbar radiculopathy  M54.16 XR C-ARM NO REPORT    Epidural Steroid injection    methylPREDNISolone acetate (DEPO-MEDROL) injection 80 mg      Plan: No additional findings.   Meds & Orders:  Meds ordered this encounter  Medications   methylPREDNISolone acetate (DEPO-MEDROL) injection 80 mg    Orders Placed This Encounter  Procedures   XR C-ARM NO REPORT   Epidural Steroid injection    Follow-up: Return if symptoms worsen or fail to improve.   Procedures: No procedures performed  Lumbar Epidural Steroid Injection - Interlaminar Approach with Fluoroscopic Guidance  Patient: DENNISHA Vaughan      Date of Birth: 20-Dec-1940 MRN: [id number] PCP: Eunice Blase, MD      Visit Date: 11/23/2021   Universal Protocol:     Consent Given By: the patient  Position: PRONE  Additional Comments: Vital signs were monitored before and after the procedure. Patient was prepped and draped in the usual sterile fashion. The correct patient, procedure, and site was verified.   Injection Procedure Details:   Procedure diagnoses: Lumbar radiculopathy  [M54.16]   Meds Administered:  Meds ordered this encounter  Medications   methylPREDNISolone acetate (DEPO-MEDROL) injection 80 mg     Laterality: Right  Location/Site:  L4-5  Needle: 3.5 in., 20 ga. Tuohy  Needle Placement: Paramedian epidural  Findings:   -Comments: Excellent flow of contrast into the epidural space.  Procedure Details: Using a paramedian approach from the side mentioned above, the region overlying the inferior lamina was localized under fluoroscopic visualization and the soft tissues overlying this structure were infiltrated with 4 ml. of 1% Lidocaine without Epinephrine. The Tuohy needle was inserted into the epidural space using a paramedian approach.   The epidural space was localized using loss of resistance along with counter oblique bi-planar fluoroscopic views.  After negative aspirate for air, blood, and CSF, a 2 ml. volume of Isovue-250 was injected into the epidural space and the flow of contrast was observed. Radiographs were obtained for documentation purposes.    The injectate was administered into the level noted above.   Additional Comments:  No complications occurred Dressing: 2 x 2 sterile gauze and Band-Aid    Post-procedure details: Patient was observed during the procedure. Post-procedure instructions were reviewed.  Patient left the clinic in stable condition.   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  Lumbar spondylosis throughout. ----  Findings: AP, lateral, and cone-down lateral views of the lumbar spine.   Rightward thoracolumbar spinal curvature. Disc space narrowing and  osteophytes from L1-2 through L3-4. Mild osteophyte formation at L4-5.  Retrolisthesis  at L2-3 measures approximate 4 mm. Retrolisthesis at L3-4  measures approximately 3 mm. Patient is mildly rotated which slightly  limits evaluation. Bone mineralization may be decreased. No fracture is  identified.   Prominent atherosclerotic calcification of the abdominal aorta and iliac  arteries.  Specimen Collected: -  Date: 08/13/16     Objective:  VS:  HT:     WT:    BMI:      BP:132/83   HR:90bpm   TEMP: ( )   RESP:  Physical Exam Vitals and nursing note reviewed.  Constitutional:      General: She is not in acute distress.    Appearance: Normal appearance. She is not ill-appearing.  HENT:     Head: Normocephalic and atraumatic.     Right Ear: External ear normal.     Left Ear: External ear normal.  Eyes:     Extraocular Movements: Extraocular movements intact.  Cardiovascular:     Rate and Rhythm: Normal rate.     Pulses: Normal pulses.  Pulmonary:     Effort: Pulmonary effort is normal. No respiratory distress.  Abdominal:     General: There is no distension.     Palpations: Abdomen is soft.  Musculoskeletal:        General: Tenderness present.     Cervical back: Neck supple.     Right lower leg: No edema.     Left lower leg: No edema.     Comments: Patient has good distal strength with no pain over the greater trochanters.  No clonus or focal weakness.  Skin:    Findings: No erythema, lesion or rash.  Neurological:     General: No focal deficit present.     Mental Status: She is alert and oriented to person, place, and time.     Sensory: No sensory deficit.     Motor: No weakness or abnormal muscle tone.     Coordination: Coordination normal.  Psychiatric:        Mood and Affect: Mood normal.        Behavior: Behavior normal.     Imaging: No results found.

## 2021-12-19 DIAGNOSIS — M25512 Pain in left shoulder: Secondary | ICD-10-CM | POA: Diagnosis not present

## 2021-12-19 DIAGNOSIS — M25559 Pain in unspecified hip: Secondary | ICD-10-CM | POA: Diagnosis not present

## 2021-12-19 DIAGNOSIS — M545 Low back pain, unspecified: Secondary | ICD-10-CM | POA: Diagnosis not present

## 2021-12-21 DIAGNOSIS — M25512 Pain in left shoulder: Secondary | ICD-10-CM | POA: Diagnosis not present

## 2021-12-21 DIAGNOSIS — M25559 Pain in unspecified hip: Secondary | ICD-10-CM | POA: Diagnosis not present

## 2021-12-21 DIAGNOSIS — M545 Low back pain, unspecified: Secondary | ICD-10-CM | POA: Diagnosis not present

## 2021-12-22 DIAGNOSIS — M1711 Unilateral primary osteoarthritis, right knee: Secondary | ICD-10-CM | POA: Diagnosis not present

## 2021-12-26 DIAGNOSIS — M545 Low back pain, unspecified: Secondary | ICD-10-CM | POA: Diagnosis not present

## 2021-12-26 DIAGNOSIS — M25559 Pain in unspecified hip: Secondary | ICD-10-CM | POA: Diagnosis not present

## 2021-12-26 DIAGNOSIS — M25512 Pain in left shoulder: Secondary | ICD-10-CM | POA: Diagnosis not present

## 2021-12-28 DIAGNOSIS — M25512 Pain in left shoulder: Secondary | ICD-10-CM | POA: Diagnosis not present

## 2021-12-28 DIAGNOSIS — M545 Low back pain, unspecified: Secondary | ICD-10-CM | POA: Diagnosis not present

## 2021-12-28 DIAGNOSIS — M25559 Pain in unspecified hip: Secondary | ICD-10-CM | POA: Diagnosis not present

## 2022-01-04 DIAGNOSIS — M25512 Pain in left shoulder: Secondary | ICD-10-CM | POA: Diagnosis not present

## 2022-01-04 DIAGNOSIS — M545 Low back pain, unspecified: Secondary | ICD-10-CM | POA: Diagnosis not present

## 2022-01-04 DIAGNOSIS — M25559 Pain in unspecified hip: Secondary | ICD-10-CM | POA: Diagnosis not present

## 2022-01-09 DIAGNOSIS — M25512 Pain in left shoulder: Secondary | ICD-10-CM | POA: Diagnosis not present

## 2022-01-09 DIAGNOSIS — M545 Low back pain, unspecified: Secondary | ICD-10-CM | POA: Diagnosis not present

## 2022-01-09 DIAGNOSIS — M25559 Pain in unspecified hip: Secondary | ICD-10-CM | POA: Diagnosis not present

## 2022-01-10 ENCOUNTER — Encounter (HOSPITAL_COMMUNITY): Payer: Self-pay | Admitting: Cardiovascular Disease

## 2022-01-10 ENCOUNTER — Ambulatory Visit (HOSPITAL_COMMUNITY): Payer: Medicare Other | Attending: Cardiology

## 2022-01-11 DIAGNOSIS — M25559 Pain in unspecified hip: Secondary | ICD-10-CM | POA: Diagnosis not present

## 2022-01-11 DIAGNOSIS — M25512 Pain in left shoulder: Secondary | ICD-10-CM | POA: Diagnosis not present

## 2022-01-11 DIAGNOSIS — M545 Low back pain, unspecified: Secondary | ICD-10-CM | POA: Diagnosis not present

## 2022-01-16 DIAGNOSIS — M25512 Pain in left shoulder: Secondary | ICD-10-CM | POA: Diagnosis not present

## 2022-01-16 DIAGNOSIS — M25559 Pain in unspecified hip: Secondary | ICD-10-CM | POA: Diagnosis not present

## 2022-01-16 DIAGNOSIS — M545 Low back pain, unspecified: Secondary | ICD-10-CM | POA: Diagnosis not present

## 2022-01-18 DIAGNOSIS — M25559 Pain in unspecified hip: Secondary | ICD-10-CM | POA: Diagnosis not present

## 2022-01-18 DIAGNOSIS — M545 Low back pain, unspecified: Secondary | ICD-10-CM | POA: Diagnosis not present

## 2022-01-18 DIAGNOSIS — M25512 Pain in left shoulder: Secondary | ICD-10-CM | POA: Diagnosis not present

## 2022-01-25 DIAGNOSIS — M545 Low back pain, unspecified: Secondary | ICD-10-CM | POA: Diagnosis not present

## 2022-01-25 DIAGNOSIS — M25512 Pain in left shoulder: Secondary | ICD-10-CM | POA: Diagnosis not present

## 2022-01-25 DIAGNOSIS — M25559 Pain in unspecified hip: Secondary | ICD-10-CM | POA: Diagnosis not present

## 2022-01-30 DIAGNOSIS — M545 Low back pain, unspecified: Secondary | ICD-10-CM | POA: Diagnosis not present

## 2022-01-30 DIAGNOSIS — M25559 Pain in unspecified hip: Secondary | ICD-10-CM | POA: Diagnosis not present

## 2022-01-30 DIAGNOSIS — M25512 Pain in left shoulder: Secondary | ICD-10-CM | POA: Diagnosis not present

## 2022-02-01 DIAGNOSIS — M25512 Pain in left shoulder: Secondary | ICD-10-CM | POA: Diagnosis not present

## 2022-02-01 DIAGNOSIS — M25559 Pain in unspecified hip: Secondary | ICD-10-CM | POA: Diagnosis not present

## 2022-02-01 DIAGNOSIS — M545 Low back pain, unspecified: Secondary | ICD-10-CM | POA: Diagnosis not present

## 2022-02-27 DIAGNOSIS — M545 Low back pain, unspecified: Secondary | ICD-10-CM | POA: Diagnosis not present

## 2022-02-27 DIAGNOSIS — M25512 Pain in left shoulder: Secondary | ICD-10-CM | POA: Diagnosis not present

## 2022-02-27 DIAGNOSIS — M25559 Pain in unspecified hip: Secondary | ICD-10-CM | POA: Diagnosis not present

## 2022-03-01 DIAGNOSIS — M545 Low back pain, unspecified: Secondary | ICD-10-CM | POA: Diagnosis not present

## 2022-03-01 DIAGNOSIS — M25559 Pain in unspecified hip: Secondary | ICD-10-CM | POA: Diagnosis not present

## 2022-03-01 DIAGNOSIS — M25512 Pain in left shoulder: Secondary | ICD-10-CM | POA: Diagnosis not present

## 2022-03-06 DIAGNOSIS — M25512 Pain in left shoulder: Secondary | ICD-10-CM | POA: Diagnosis not present

## 2022-03-06 DIAGNOSIS — M25559 Pain in unspecified hip: Secondary | ICD-10-CM | POA: Diagnosis not present

## 2022-03-06 DIAGNOSIS — M545 Low back pain, unspecified: Secondary | ICD-10-CM | POA: Diagnosis not present

## 2022-03-08 DIAGNOSIS — M545 Low back pain, unspecified: Secondary | ICD-10-CM | POA: Diagnosis not present

## 2022-03-08 DIAGNOSIS — M25512 Pain in left shoulder: Secondary | ICD-10-CM | POA: Diagnosis not present

## 2022-03-08 DIAGNOSIS — M25559 Pain in unspecified hip: Secondary | ICD-10-CM | POA: Diagnosis not present

## 2022-03-13 DIAGNOSIS — M25559 Pain in unspecified hip: Secondary | ICD-10-CM | POA: Diagnosis not present

## 2022-03-13 DIAGNOSIS — M545 Low back pain, unspecified: Secondary | ICD-10-CM | POA: Diagnosis not present

## 2022-03-13 DIAGNOSIS — M25512 Pain in left shoulder: Secondary | ICD-10-CM | POA: Diagnosis not present

## 2022-03-14 ENCOUNTER — Ambulatory Visit: Payer: Medicare Other | Admitting: Cardiovascular Disease

## 2022-03-14 ENCOUNTER — Encounter: Payer: Self-pay | Admitting: Cardiovascular Disease

## 2022-03-14 VITALS — BP 115/79 | HR 73 | Ht 60.0 in | Wt 119.0 lb

## 2022-03-14 DIAGNOSIS — E782 Mixed hyperlipidemia: Secondary | ICD-10-CM | POA: Diagnosis not present

## 2022-03-14 DIAGNOSIS — I340 Nonrheumatic mitral (valve) insufficiency: Secondary | ICD-10-CM

## 2022-03-14 NOTE — Progress Notes (Signed)
? ? ? ?03/14/2022 ?Michelle Vaughan   ?[birth year]  ?[id number] ? ?Primary Physician Eunice Blase, MD ?Primary Cardiologist: Lorretta Harp MD Michelle Vaughan, [place] ? ?HPI:  Michelle Vaughan is a 81 y.o. thin-appearing divorced Caucasian female mother of 2 living sons with 65 grandchildren referred by Dr. Junius Roads for a second opinion.  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  She was seeing Dr. Johnsie Cancel.  Her problems include treated hyperlipidemia.  There is a question of an aneurysm although CT scan performed 03/11/2020 showed no evidence of an a sending thoracic aortic aneurysm measuring at most 3.1 cm.  There is no family history of heart disease.  She is never had a heart attack or stroke.  She denies chest pain or shortness of breath.  Major problem is back pain.  At this point, I see no evidence that she has an aneurysm.  No further work-up is necessary.  A 2D echo performed 01/06/2021 did show moderate mitral regurgitation which we will continue to follow. ? ? ?Current Meds  ?Medication Sig  ? calcium-vitamin D (OSCAL WITH D) 500-200 MG-UNIT tablet Take 1 tablet by mouth.  ? clonazePAM (KLONOPIN) 0.5 MG tablet 1/2 tablet nightly as needed anxiety.  ? clonazePAM (KLONOPIN) 0.5 MG tablet 1/2 tablet po qhs prn anxiety  ? folic acid (FOLVITE) 979 MCG tablet Take 400 mcg by mouth daily.  ? Ibuprofen-diphenhydrAMINE Cit (IBUPROFEN PM PO) Take by mouth at bedtime as needed.  ? Magnesium 250 MG TABS Take by mouth daily as needed.  ? Multiple Vitamin (MULTIVITAMIN) tablet Take 1 tablet by mouth daily.  ? Potassium 99 MG TABS Take by mouth daily as needed.  ?  ? ?Allergies  ?Allergen Reactions  ? Penicillins Rash  ?  Did it involve swelling of the face/tongue/throat, SOB, or low BP? No ?Did it involve sudden or severe rash/hives, skin peeling, or any reaction on the inside of your mouth or nose? Unknown ?Did you need to seek medical attention at a hospital or doctor's office? No ?When did it last  happen?      In her 66s ?If all above answers are ?NO?, may proceed with cephalosporin use. ?  ? Metronidazole Rash  ? ? ?Social History  ? ?Socioeconomic History  ? Marital status: Single  ?  Spouse name: Not on file  ? Number of children: Not on file  ? Years of education: Not on file  ? Highest education level: Not on file  ?Occupational History  ? Not on file  ?Tobacco Use  ? Smoking status: Former  ?  Packs/day: 0.50  ?  Years: 57.00  ?  Pack years: 28.50  ?  Types: Cigarettes  ?  Quit date: 02/11/2015  ?  Years since quitting: 7.0  ? Smokeless tobacco: Never  ?Vaping Use  ? Vaping Use: Never used  ?Substance and Sexual Activity  ? Alcohol use: Not Currently  ? Drug use: Never  ? Sexual activity: Not on file  ?Other Topics Concern  ? Not on file  ?Social History Narrative  ? Not on file  ? ?Social Determinants of Health  ? ?Financial Resource Strain: Not on file  ?Food Insecurity: Not on file  ?Transportation Needs: Not on file  ?Physical Activity: Not on file  ?Stress: Not on file  ?Social Connections: Not on file  ?Intimate Partner Violence: Not on file  ?  ? ?Review of Systems: ?General: negative for chills, fever, night sweats  or weight changes.  ?Cardiovascular: negative for chest pain, dyspnea on exertion, edema, orthopnea, palpitations, paroxysmal nocturnal dyspnea or shortness of breath ?Dermatological: negative for rash ?Respiratory: negative for cough or wheezing ?Urologic: negative for hematuria ?Abdominal: negative for nausea, vomiting, diarrhea, bright red blood per rectum, melena, or hematemesis ?Neurologic: negative for visual changes, syncope, or dizziness ?All other systems reviewed and are otherwise negative except as noted above. ? ? ? ?Blood pressure 115/79, pulse 73, height 5' (1.524 m), weight 119 lb (54 kg), SpO2 95 %.  ?General appearance: alert and no distress ?Neck: no adenopathy, no carotid bruit, no JVD, supple, symmetrical, trachea midline, and thyroid not enlarged, symmetric, no  tenderness/mass/nodules ?Lungs: clear to auscultation bilaterally ?Heart: regular rate and rhythm, S1, S2 normal, no murmur, click, rub or gallop ?Extremities: extremities normal, atraumatic, no cyanosis or edema ?Pulses: 2+ and symmetric ?Skin: Skin color, texture, turgor normal. No rashes or lesions ?Neurologic: Grossly normal ? ?EKG sinus rhythm at 73 without ST or T wave changes.  I personally reviewed this EKG. ? ?ASSESSMENT AND PLAN:  ? ?Hyperlipidemia ?History of hyperlipidemia on statin therapy followed by her PCP ? ?Mitral regurgitation ?Recent 2D echo performed 01/06/2021 showed normal LV systolic function with moderate MR.  This represents a slight worsening of her MR since her prior echo.  We will  repeat a 2D echocardiogram. ? ? ? ? ?Lorretta Harp MD [facility], FSC[facility] ?03/14/2022 ?2:57 PM ?

## 2022-03-14 NOTE — Patient Instructions (Signed)
?  Testing/Procedures:  Your physician has requested that you have an echocardiogram. Echocardiography is a painless test that uses sound waves to create images of your heart. It provides your doctor with information about the size and shape of your heart and how well your heart's chambers and valves are working. This procedure takes approximately one hour. There are no restrictions for this procedure. [address]   Follow-Up: At [facility], you and your health needs are our priority.  As part of our continuing mission to provide you with exceptional heart care, we have created designated Provider Care Teams.  These Care Teams include your primary Cardiologist (physician) and Advanced Practice Providers (APPs -  Physician Assistants and Nurse Practitioners) who all work together to provide you with the care you need, when you need it.  We recommend signing up for the patient portal called "MyChart".  Sign up information is provided on this After Visit Summary.  MyChart is used to connect with patients for Virtual Visits (Telemedicine).  Patients are able to view lab/test results, encounter notes, upcoming appointments, etc.  Non-urgent messages can be sent to your provider as well.   To learn more about what you can do with MyChart, go to [email or web address].    Your next appointment:    AS NEEDED  Important Information About Sugar       

## 2022-03-14 NOTE — Assessment & Plan Note (Signed)
History of hyperlipidemia on statin therapy followed by her PCP. 

## 2022-03-14 NOTE — Assessment & Plan Note (Signed)
Recent 2D echo performed 01/06/2021 showed normal LV systolic function with moderate MR.  This represents a slight worsening of her MR since her prior echo.  We will  repeat a 2D echocardiogram. ?

## 2022-03-15 DIAGNOSIS — M545 Low back pain, unspecified: Secondary | ICD-10-CM | POA: Diagnosis not present

## 2022-03-15 DIAGNOSIS — M25559 Pain in unspecified hip: Secondary | ICD-10-CM | POA: Diagnosis not present

## 2022-03-15 DIAGNOSIS — M25512 Pain in left shoulder: Secondary | ICD-10-CM | POA: Diagnosis not present

## 2022-03-20 DIAGNOSIS — M545 Low back pain, unspecified: Secondary | ICD-10-CM | POA: Diagnosis not present

## 2022-03-20 DIAGNOSIS — M25512 Pain in left shoulder: Secondary | ICD-10-CM | POA: Diagnosis not present

## 2022-03-20 DIAGNOSIS — M25559 Pain in unspecified hip: Secondary | ICD-10-CM | POA: Diagnosis not present

## 2022-03-21 ENCOUNTER — Ambulatory Visit (INDEPENDENT_AMBULATORY_CARE_PROVIDER_SITE_OTHER): Payer: Medicare Other

## 2022-03-21 DIAGNOSIS — I340 Nonrheumatic mitral (valve) insufficiency: Secondary | ICD-10-CM | POA: Diagnosis not present

## 2022-03-21 LAB — ECHOCARDIOGRAM COMPLETE
AR max vel: 2.64 cm2
AV Area VTI: 2.92 cm2
AV Area mean vel: 2.98 cm2
AV Mean grad: 3 mmHg
AV Peak grad: 8.2 mmHg
AV Vena cont: 0.39 cm
Ao pk vel: 1.43 m/s
Area-P 1/2: 2.7 cm2
Calc EF: 74.6 %
P 1/2 time: 942 msec
S' Lateral: 1.69 cm
Single Plane A2C EF: 70.2 %
Single Plane A4C EF: 75.3 %

## 2022-03-23 ENCOUNTER — Other Ambulatory Visit (HOSPITAL_BASED_OUTPATIENT_CLINIC_OR_DEPARTMENT_OTHER): Payer: Medicare Other

## 2022-03-27 DIAGNOSIS — M545 Low back pain, unspecified: Secondary | ICD-10-CM | POA: Diagnosis not present

## 2022-03-27 DIAGNOSIS — M25559 Pain in unspecified hip: Secondary | ICD-10-CM | POA: Diagnosis not present

## 2022-03-27 DIAGNOSIS — M25512 Pain in left shoulder: Secondary | ICD-10-CM | POA: Diagnosis not present

## 2022-03-29 DIAGNOSIS — M25512 Pain in left shoulder: Secondary | ICD-10-CM | POA: Diagnosis not present

## 2022-03-29 DIAGNOSIS — M545 Low back pain, unspecified: Secondary | ICD-10-CM | POA: Diagnosis not present

## 2022-03-29 DIAGNOSIS — M25559 Pain in unspecified hip: Secondary | ICD-10-CM | POA: Diagnosis not present

## 2022-04-02 DIAGNOSIS — M25559 Pain in unspecified hip: Secondary | ICD-10-CM | POA: Diagnosis not present

## 2022-04-02 DIAGNOSIS — M545 Low back pain, unspecified: Secondary | ICD-10-CM | POA: Diagnosis not present

## 2022-04-02 DIAGNOSIS — M25512 Pain in left shoulder: Secondary | ICD-10-CM | POA: Diagnosis not present

## 2022-04-05 DIAGNOSIS — L570 Actinic keratosis: Secondary | ICD-10-CM | POA: Diagnosis not present

## 2022-04-05 DIAGNOSIS — L219 Seborrheic dermatitis, unspecified: Secondary | ICD-10-CM | POA: Diagnosis not present

## 2022-04-05 DIAGNOSIS — M545 Low back pain, unspecified: Secondary | ICD-10-CM | POA: Diagnosis not present

## 2022-04-05 DIAGNOSIS — M25559 Pain in unspecified hip: Secondary | ICD-10-CM | POA: Diagnosis not present

## 2022-04-05 DIAGNOSIS — L821 Other seborrheic keratosis: Secondary | ICD-10-CM | POA: Diagnosis not present

## 2022-04-05 DIAGNOSIS — M25512 Pain in left shoulder: Secondary | ICD-10-CM | POA: Diagnosis not present

## 2022-04-10 DIAGNOSIS — M25559 Pain in unspecified hip: Secondary | ICD-10-CM | POA: Diagnosis not present

## 2022-04-10 DIAGNOSIS — M545 Low back pain, unspecified: Secondary | ICD-10-CM | POA: Diagnosis not present

## 2022-04-10 DIAGNOSIS — M25512 Pain in left shoulder: Secondary | ICD-10-CM | POA: Diagnosis not present

## 2022-04-17 DIAGNOSIS — M25512 Pain in left shoulder: Secondary | ICD-10-CM | POA: Diagnosis not present

## 2022-04-17 DIAGNOSIS — M545 Low back pain, unspecified: Secondary | ICD-10-CM | POA: Diagnosis not present

## 2022-04-17 DIAGNOSIS — M25559 Pain in unspecified hip: Secondary | ICD-10-CM | POA: Diagnosis not present

## 2022-04-19 DIAGNOSIS — M25512 Pain in left shoulder: Secondary | ICD-10-CM | POA: Diagnosis not present

## 2022-04-19 DIAGNOSIS — M25559 Pain in unspecified hip: Secondary | ICD-10-CM | POA: Diagnosis not present

## 2022-04-19 DIAGNOSIS — M545 Low back pain, unspecified: Secondary | ICD-10-CM | POA: Diagnosis not present

## 2022-04-24 DIAGNOSIS — M25559 Pain in unspecified hip: Secondary | ICD-10-CM | POA: Diagnosis not present

## 2022-04-24 DIAGNOSIS — M25512 Pain in left shoulder: Secondary | ICD-10-CM | POA: Diagnosis not present

## 2022-04-24 DIAGNOSIS — M545 Low back pain, unspecified: Secondary | ICD-10-CM | POA: Diagnosis not present

## 2022-05-03 DIAGNOSIS — M545 Low back pain, unspecified: Secondary | ICD-10-CM | POA: Diagnosis not present

## 2022-05-03 DIAGNOSIS — M25512 Pain in left shoulder: Secondary | ICD-10-CM | POA: Diagnosis not present

## 2022-05-03 DIAGNOSIS — M25559 Pain in unspecified hip: Secondary | ICD-10-CM | POA: Diagnosis not present

## 2022-05-10 DIAGNOSIS — M545 Low back pain, unspecified: Secondary | ICD-10-CM | POA: Diagnosis not present

## 2022-05-10 DIAGNOSIS — M25512 Pain in left shoulder: Secondary | ICD-10-CM | POA: Diagnosis not present

## 2022-05-10 DIAGNOSIS — M25559 Pain in unspecified hip: Secondary | ICD-10-CM | POA: Diagnosis not present

## 2022-05-24 DIAGNOSIS — M25512 Pain in left shoulder: Secondary | ICD-10-CM | POA: Diagnosis not present

## 2022-05-24 DIAGNOSIS — M25559 Pain in unspecified hip: Secondary | ICD-10-CM | POA: Diagnosis not present

## 2022-05-24 DIAGNOSIS — M545 Low back pain, unspecified: Secondary | ICD-10-CM | POA: Diagnosis not present

## 2022-06-28 DIAGNOSIS — M25512 Pain in left shoulder: Secondary | ICD-10-CM | POA: Diagnosis not present

## 2022-06-28 DIAGNOSIS — M545 Low back pain, unspecified: Secondary | ICD-10-CM | POA: Diagnosis not present

## 2022-06-28 DIAGNOSIS — M25559 Pain in unspecified hip: Secondary | ICD-10-CM | POA: Diagnosis not present

## 2022-07-06 DIAGNOSIS — M25559 Pain in unspecified hip: Secondary | ICD-10-CM | POA: Diagnosis not present

## 2022-07-06 DIAGNOSIS — M545 Low back pain, unspecified: Secondary | ICD-10-CM | POA: Diagnosis not present

## 2022-07-06 DIAGNOSIS — M25512 Pain in left shoulder: Secondary | ICD-10-CM | POA: Diagnosis not present

## 2022-07-10 DIAGNOSIS — M25559 Pain in unspecified hip: Secondary | ICD-10-CM | POA: Diagnosis not present

## 2022-07-10 DIAGNOSIS — M545 Low back pain, unspecified: Secondary | ICD-10-CM | POA: Diagnosis not present

## 2022-07-10 DIAGNOSIS — M25512 Pain in left shoulder: Secondary | ICD-10-CM | POA: Diagnosis not present

## 2022-07-12 DIAGNOSIS — L720 Epidermal cyst: Secondary | ICD-10-CM | POA: Diagnosis not present

## 2022-07-12 DIAGNOSIS — L723 Sebaceous cyst: Secondary | ICD-10-CM | POA: Diagnosis not present

## 2022-07-12 DIAGNOSIS — L570 Actinic keratosis: Secondary | ICD-10-CM | POA: Diagnosis not present

## 2022-07-12 DIAGNOSIS — L821 Other seborrheic keratosis: Secondary | ICD-10-CM | POA: Diagnosis not present

## 2022-07-12 DIAGNOSIS — D1801 Hemangioma of skin and subcutaneous tissue: Secondary | ICD-10-CM | POA: Diagnosis not present

## 2022-07-19 DIAGNOSIS — M25559 Pain in unspecified hip: Secondary | ICD-10-CM | POA: Diagnosis not present

## 2022-07-19 DIAGNOSIS — M25512 Pain in left shoulder: Secondary | ICD-10-CM | POA: Diagnosis not present

## 2022-07-19 DIAGNOSIS — M545 Low back pain, unspecified: Secondary | ICD-10-CM | POA: Diagnosis not present

## 2022-07-26 DIAGNOSIS — M25559 Pain in unspecified hip: Secondary | ICD-10-CM | POA: Diagnosis not present

## 2022-07-26 DIAGNOSIS — M25512 Pain in left shoulder: Secondary | ICD-10-CM | POA: Diagnosis not present

## 2022-07-26 DIAGNOSIS — M545 Low back pain, unspecified: Secondary | ICD-10-CM | POA: Diagnosis not present

## 2022-08-02 DIAGNOSIS — M25559 Pain in unspecified hip: Secondary | ICD-10-CM | POA: Diagnosis not present

## 2022-08-02 DIAGNOSIS — M25512 Pain in left shoulder: Secondary | ICD-10-CM | POA: Diagnosis not present

## 2022-08-02 DIAGNOSIS — M545 Low back pain, unspecified: Secondary | ICD-10-CM | POA: Diagnosis not present

## 2022-08-09 DIAGNOSIS — M25559 Pain in unspecified hip: Secondary | ICD-10-CM | POA: Diagnosis not present

## 2022-08-09 DIAGNOSIS — M25512 Pain in left shoulder: Secondary | ICD-10-CM | POA: Diagnosis not present

## 2022-08-09 DIAGNOSIS — M545 Low back pain, unspecified: Secondary | ICD-10-CM | POA: Diagnosis not present

## 2022-08-16 DIAGNOSIS — M25559 Pain in unspecified hip: Secondary | ICD-10-CM | POA: Diagnosis not present

## 2022-08-16 DIAGNOSIS — M25512 Pain in left shoulder: Secondary | ICD-10-CM | POA: Diagnosis not present

## 2022-08-16 DIAGNOSIS — M545 Low back pain, unspecified: Secondary | ICD-10-CM | POA: Diagnosis not present

## 2022-08-23 DIAGNOSIS — M25512 Pain in left shoulder: Secondary | ICD-10-CM | POA: Diagnosis not present

## 2022-08-23 DIAGNOSIS — M25559 Pain in unspecified hip: Secondary | ICD-10-CM | POA: Diagnosis not present

## 2022-08-23 DIAGNOSIS — M545 Low back pain, unspecified: Secondary | ICD-10-CM | POA: Diagnosis not present

## 2022-09-27 DIAGNOSIS — M25559 Pain in unspecified hip: Secondary | ICD-10-CM | POA: Diagnosis not present

## 2022-09-27 DIAGNOSIS — M545 Low back pain, unspecified: Secondary | ICD-10-CM | POA: Diagnosis not present

## 2022-09-27 DIAGNOSIS — M25512 Pain in left shoulder: Secondary | ICD-10-CM | POA: Diagnosis not present

## 2022-10-09 DIAGNOSIS — M1711 Unilateral primary osteoarthritis, right knee: Secondary | ICD-10-CM | POA: Diagnosis not present

## 2022-10-09 DIAGNOSIS — M810 Age-related osteoporosis without current pathological fracture: Secondary | ICD-10-CM | POA: Diagnosis not present

## 2022-10-09 DIAGNOSIS — I272 Pulmonary hypertension, unspecified: Secondary | ICD-10-CM | POA: Diagnosis not present

## 2022-10-09 DIAGNOSIS — E785 Hyperlipidemia, unspecified: Secondary | ICD-10-CM | POA: Diagnosis not present

## 2022-10-10 ENCOUNTER — Other Ambulatory Visit: Payer: Self-pay | Admitting: Family Medicine

## 2022-10-10 DIAGNOSIS — M810 Age-related osteoporosis without current pathological fracture: Secondary | ICD-10-CM

## 2022-10-11 DIAGNOSIS — H3530 Unspecified macular degeneration: Secondary | ICD-10-CM | POA: Diagnosis not present

## 2022-10-12 DIAGNOSIS — L570 Actinic keratosis: Secondary | ICD-10-CM | POA: Diagnosis not present

## 2022-10-12 DIAGNOSIS — D692 Other nonthrombocytopenic purpura: Secondary | ICD-10-CM | POA: Diagnosis not present

## 2022-10-18 DIAGNOSIS — M545 Low back pain, unspecified: Secondary | ICD-10-CM | POA: Diagnosis not present

## 2022-10-18 DIAGNOSIS — M25559 Pain in unspecified hip: Secondary | ICD-10-CM | POA: Diagnosis not present

## 2022-10-18 DIAGNOSIS — M25512 Pain in left shoulder: Secondary | ICD-10-CM | POA: Diagnosis not present

## 2023-01-04 DIAGNOSIS — L84 Corns and callosities: Secondary | ICD-10-CM | POA: Diagnosis not present

## 2023-01-04 DIAGNOSIS — M79672 Pain in left foot: Secondary | ICD-10-CM | POA: Diagnosis not present

## 2023-01-04 DIAGNOSIS — L603 Nail dystrophy: Secondary | ICD-10-CM | POA: Diagnosis not present

## 2023-01-04 DIAGNOSIS — I739 Peripheral vascular disease, unspecified: Secondary | ICD-10-CM | POA: Diagnosis not present

## 2023-01-04 DIAGNOSIS — M79671 Pain in right foot: Secondary | ICD-10-CM | POA: Diagnosis not present

## 2023-03-29 ENCOUNTER — Inpatient Hospital Stay: Admission: RE | Admit: 2023-03-29 | Payer: Medicare Other | Source: Ambulatory Visit

## 2023-08-15 ENCOUNTER — Other Ambulatory Visit: Payer: Self-pay | Admitting: Family Medicine

## 2023-08-15 DIAGNOSIS — M545 Low back pain, unspecified: Secondary | ICD-10-CM

## 2023-08-15 DIAGNOSIS — G8929 Other chronic pain: Secondary | ICD-10-CM

## 2023-08-15 DIAGNOSIS — M546 Pain in thoracic spine: Secondary | ICD-10-CM

## 2023-08-16 ENCOUNTER — Other Ambulatory Visit: Payer: Self-pay | Admitting: Physician Assistant

## 2023-08-16 DIAGNOSIS — M5411 Radiculopathy, occipito-atlanto-axial region: Secondary | ICD-10-CM

## 2023-08-16 DIAGNOSIS — M7912 Myalgia of auxiliary muscles, head and neck: Secondary | ICD-10-CM

## 2023-08-16 DIAGNOSIS — M47812 Spondylosis without myelopathy or radiculopathy, cervical region: Secondary | ICD-10-CM

## 2023-08-16 DIAGNOSIS — M546 Pain in thoracic spine: Secondary | ICD-10-CM

## 2023-08-26 ENCOUNTER — Ambulatory Visit
Admission: RE | Admit: 2023-08-26 | Discharge: 2023-08-26 | Disposition: A | Discharge status: Home / Self Care | Payer: Medicare Other | Source: Ambulatory Visit | Attending: Physician Assistant | Admitting: Physician Assistant

## 2023-08-26 DIAGNOSIS — M7912 Myalgia of auxiliary muscles, head and neck: Secondary | ICD-10-CM

## 2023-08-26 DIAGNOSIS — M546 Pain in thoracic spine: Secondary | ICD-10-CM

## 2023-08-26 DIAGNOSIS — M47812 Spondylosis without myelopathy or radiculopathy, cervical region: Secondary | ICD-10-CM

## 2023-08-26 DIAGNOSIS — M5411 Radiculopathy, occipito-atlanto-axial region: Secondary | ICD-10-CM

## 2024-10-12 ENCOUNTER — Encounter: Payer: Self-pay | Admitting: Family Medicine

## 2024-10-14 ENCOUNTER — Other Ambulatory Visit: Payer: Self-pay | Admitting: Family Medicine

## 2024-10-14 DIAGNOSIS — M47812 Spondylosis without myelopathy or radiculopathy, cervical region: Secondary | ICD-10-CM

## 2024-10-29 ENCOUNTER — Other Ambulatory Visit

## 2024-10-30 ENCOUNTER — Other Ambulatory Visit

## 2024-11-19 NOTE — Discharge Instructions (Signed)

## 2024-11-20 ENCOUNTER — Ambulatory Visit
Admission: RE | Admit: 2024-11-20 | Discharge: 2024-11-20 | Disposition: A | Source: Ambulatory Visit | Attending: Family Medicine | Admitting: Family Medicine

## 2024-11-20 DIAGNOSIS — M47812 Spondylosis without myelopathy or radiculopathy, cervical region: Secondary | ICD-10-CM

## 2024-11-20 MED ORDER — TRIAMCINOLONE ACETONIDE 40 MG/ML IJ SUSP (RADIOLOGY)
60.0000 mg | Freq: Once | INTRAMUSCULAR | Status: AC
  Administered 2024-11-20: 60 mg via EPIDURAL

## 2024-11-20 MED ORDER — IOPAMIDOL (ISOVUE-M 300) INJECTION 61%
1.0000 mL | Freq: Once | INTRAMUSCULAR | Status: AC | PRN
  Administered 2024-11-20: 1 mL via EPIDURAL

## 2024-11-27 ENCOUNTER — Other Ambulatory Visit: Payer: Self-pay | Admitting: Family Medicine

## 2024-11-27 DIAGNOSIS — M47812 Spondylosis without myelopathy or radiculopathy, cervical region: Secondary | ICD-10-CM

## 2024-11-27 DIAGNOSIS — M47816 Spondylosis without myelopathy or radiculopathy, lumbar region: Secondary | ICD-10-CM

## 2024-12-09 NOTE — Discharge Instructions (Signed)

## 2024-12-10 ENCOUNTER — Ambulatory Visit
Admission: RE | Admit: 2024-12-10 | Discharge: 2024-12-10 | Disposition: A | Discharge status: Home / Self Care | Source: Ambulatory Visit | Attending: Family Medicine | Admitting: Family Medicine

## 2024-12-10 DIAGNOSIS — M47812 Spondylosis without myelopathy or radiculopathy, cervical region: Secondary | ICD-10-CM

## 2024-12-10 DIAGNOSIS — M47816 Spondylosis without myelopathy or radiculopathy, lumbar region: Secondary | ICD-10-CM

## 2024-12-10 MED ORDER — IOPAMIDOL (ISOVUE-M 300) INJECTION 61%
1.0000 mL | Freq: Once | INTRAMUSCULAR | Status: AC
  Administered 2024-12-10: 1 mL via EPIDURAL

## 2024-12-10 MED ORDER — TRIAMCINOLONE ACETONIDE 40 MG/ML IJ SUSP (RADIOLOGY)
60.0000 mg | Freq: Once | INTRAMUSCULAR | Status: AC
  Administered 2024-12-10: 60 mg via EPIDURAL
